# Patient Record
Sex: Male | Born: 1975 | Race: Black or African American | Hispanic: No | Marital: Single | Smoking: Current every day smoker
Health system: Southern US, Community
[De-identification: ages and names within clinical notes are randomized; demographics above are authoritative.]

## PROBLEM LIST (undated history)

## (undated) DIAGNOSIS — T7840XA Allergy, unspecified, initial encounter: Secondary | ICD-10-CM

## (undated) DIAGNOSIS — J45909 Unspecified asthma, uncomplicated: Secondary | ICD-10-CM

## (undated) DIAGNOSIS — M1990 Unspecified osteoarthritis, unspecified site: Secondary | ICD-10-CM

## (undated) HISTORY — DX: Unspecified asthma, uncomplicated: J45.909

## (undated) HISTORY — DX: Allergy, unspecified, initial encounter: T78.40XA

## (undated) HISTORY — PX: FINGER SURGERY: SHX640

## (undated) HISTORY — PX: FRACTURE SURGERY: SHX138

## (undated) HISTORY — DX: Unspecified osteoarthritis, unspecified site: M19.90

## (undated) HISTORY — PX: JOINT REPLACEMENT: SHX530

## (undated) HISTORY — PX: WRIST SURGERY: SHX841

## (undated) HISTORY — PX: KNEE SURGERY: SHX244

---

## 1997-05-09 ENCOUNTER — Emergency Department (HOSPITAL_COMMUNITY): Admission: EM | Admit: 1997-05-09 | Discharge: 1997-05-09 | Payer: Self-pay | Admitting: Emergency Medicine

## 1998-07-26 ENCOUNTER — Emergency Department (HOSPITAL_COMMUNITY): Admission: EM | Admit: 1998-07-26 | Discharge: 1998-08-15 | Payer: Self-pay | Admitting: Emergency Medicine

## 1998-07-27 ENCOUNTER — Emergency Department (HOSPITAL_COMMUNITY): Admission: EM | Admit: 1998-07-27 | Discharge: 1998-07-27 | Payer: Self-pay | Admitting: *Deleted

## 1998-12-21 ENCOUNTER — Emergency Department (HOSPITAL_COMMUNITY): Admission: EM | Admit: 1998-12-21 | Discharge: 1998-12-21 | Payer: Self-pay | Admitting: Emergency Medicine

## 1999-06-05 ENCOUNTER — Emergency Department (HOSPITAL_COMMUNITY): Admission: EM | Admit: 1999-06-05 | Discharge: 1999-06-05 | Payer: Self-pay | Admitting: Emergency Medicine

## 2001-10-28 ENCOUNTER — Emergency Department (HOSPITAL_COMMUNITY): Admission: EM | Admit: 2001-10-28 | Discharge: 2001-10-28 | Payer: Self-pay | Admitting: Emergency Medicine

## 2003-02-27 ENCOUNTER — Emergency Department (HOSPITAL_COMMUNITY): Admission: EM | Admit: 2003-02-27 | Discharge: 2003-02-27 | Payer: Self-pay | Admitting: Emergency Medicine

## 2004-04-06 ENCOUNTER — Emergency Department (HOSPITAL_COMMUNITY): Admission: EM | Admit: 2004-04-06 | Discharge: 2004-04-06 | Payer: Self-pay | Admitting: Emergency Medicine

## 2007-10-02 ENCOUNTER — Emergency Department (HOSPITAL_COMMUNITY): Admission: EM | Admit: 2007-10-02 | Discharge: 2007-10-02 | Payer: Self-pay | Admitting: Family Medicine

## 2011-01-02 ENCOUNTER — Emergency Department (HOSPITAL_COMMUNITY)
Admission: EM | Admit: 2011-01-02 | Discharge: 2011-01-02 | Disposition: A | Discharge status: Home / Self Care | Payer: Self-pay | Attending: Emergency Medicine | Admitting: Emergency Medicine

## 2011-01-02 ENCOUNTER — Encounter: Payer: Self-pay | Admitting: Emergency Medicine

## 2011-01-02 DIAGNOSIS — K0889 Other specified disorders of teeth and supporting structures: Secondary | ICD-10-CM

## 2011-01-02 DIAGNOSIS — K089 Disorder of teeth and supporting structures, unspecified: Secondary | ICD-10-CM | POA: Insufficient documentation

## 2011-01-02 DIAGNOSIS — K029 Dental caries, unspecified: Secondary | ICD-10-CM | POA: Insufficient documentation

## 2011-01-02 DIAGNOSIS — F17200 Nicotine dependence, unspecified, uncomplicated: Secondary | ICD-10-CM | POA: Insufficient documentation

## 2011-01-02 MED ORDER — PENICILLIN V POTASSIUM 500 MG PO TABS: 500.0000 mg | ORAL_TABLET | Freq: Three times a day (TID) | ORAL | Status: AC

## 2011-01-02 MED ORDER — OXYCODONE-ACETAMINOPHEN 5-325 MG PO TABS: 2.0000 | ORAL_TABLET | ORAL | Status: AC | PRN

## 2011-01-02 MED ORDER — OXYCODONE-ACETAMINOPHEN 5-325 MG PO TABS
2.0000 | ORAL_TABLET | Freq: Once | ORAL | Status: AC
  Administered 2011-01-02: 2 via ORAL
  Filled 2011-01-02: qty 2

## 2011-01-02 NOTE — ED Notes (Signed)
Pt has toothache on the upper right side that started on Saturday. Swollen and painful to the touch. He has trouble talking and eating. Has noted some drainage.

## 2011-01-02 NOTE — ED Provider Notes (Signed)
Medical screening examination/treatment/procedure(s) were conducted as a shared visit with non-physician practitioner(s) and myself.  I personally evaluated the patient during the encounter   Dione Booze, MD 01/02/11 [phone]

## 2011-01-02 NOTE — Progress Notes (Signed)
35 year old male broke off a right upper to while eating chicken. He is having a lot of pain in that area. Examination shows severely carious tooth #6. His gingiva is mildly swollen and tender. He will need to be referred to dentist to extract the remaining part of the tooth. We placed on antibiotics and given prescription for analgesics.

## 2011-01-02 NOTE — ED Provider Notes (Signed)
History     CSN: [id number] Arrival date & time: 01/02/2011  7:50 AM   First MD Initiated Contact with Timothy Li 01/02/11 [id number]      Chief Complaint  Timothy Li presents with  . Dental Pain    (Consider location/radiation/quality/duration/timing/severity/associated sxs/prior treatment) HPI Comments: Timothy Li reports that he was eating chicken several weeks and his upper right incisor tooth broke off.  He began having pain in that tooth for the past 4 days, which is becoming progressively worse.    Timothy Li is a 35 y.o. male presenting with tooth pain. The history is provided by the Timothy Li.  Dental PainThe primary symptoms include mouth pain and dental injury. Primary symptoms do not include oral bleeding, oral lesions, headaches, fever, shortness of breath, sore throat, angioedema or cough. The symptoms began yesterday. The symptoms are unchanged. The symptoms occur constantly.  Additional symptoms include: dental sensitivity to temperature. Additional symptoms do not include: gum swelling, gum tenderness, purulent gums, trismus, facial swelling, trouble swallowing, pain with swallowing, excessive salivation, drooling, ear pain, swollen glands and fatigue. Medical issues include: periodontal disease.    History reviewed. No pertinent past medical history.  History reviewed. No pertinent past surgical history.  No family history on file.  History  Substance Use Topics  . Smoking status: Current Everyday Smoker  . Smokeless tobacco: Not on file  . Alcohol Use: Yes      Review of Systems  Constitutional: Negative for fever, diaphoresis and fatigue.  HENT: Negative for ear pain, sore throat, facial swelling, drooling, trouble swallowing, neck pain, neck stiffness, tinnitus and ear discharge.   Eyes: Negative for visual disturbance.  Respiratory: Negative for cough and shortness of breath.   Cardiovascular: Negative for chest pain.  Gastrointestinal: Negative for abdominal pain.    Skin: Negative for rash.  Neurological: Negative for dizziness, syncope, light-headedness and headaches.    Allergies  Review of Timothy Li's allergies indicates no known allergies.  Home Medications   Current Outpatient Rx  Name Route Sig Dispense Refill  . ASPIRIN EC 325 MG PO TBEC Oral Take 325 mg by mouth every 4 (four) hours as needed. For pain     . OXYCODONE-ACETAMINOPHEN 5-325 MG PO TABS Oral Take 2 tablets by mouth every 4 (four) hours as needed for pain. 6 tablet 0  . PENICILLIN V POTASSIUM 500 MG PO TABS Oral Take 1 tablet (500 mg total) by mouth 3 (three) times daily. 30 tablet 0    BP 142/84  Pulse 70  Temp(Src) 98 F (36.7 C) (Oral)  Resp 18  SpO2 100%  Physical Exam  Constitutional: He is oriented to person, place, and time. He appears well-developed and well-nourished. No distress.  HENT:  Head: Normocephalic and atraumatic. No trismus in the jaw.  Right Ear: External ear normal.  Left Ear: External ear normal.  Nose: Nose normal.  Mouth/Throat: Uvula is midline, oropharynx is clear and moist and mucous membranes are normal. No oral lesions. Abnormal dentition. Dental caries present. No dental abscesses. No oropharyngeal exudate or posterior oropharyngeal edema.    Neck: Normal range of motion. Neck supple.  Cardiovascular: Normal rate, regular rhythm and normal heart sounds.   Pulmonary/Chest: Effort normal and breath sounds normal.  Musculoskeletal: Normal range of motion.  Neurological: He is alert and oriented to person, place, and time.  Skin: Skin is warm and dry. No rash noted. He is not diaphoretic. No erythema.  Psychiatric: He has a normal mood and affect.    ED Course  Procedures (  including critical care time)  Labs Reviewed - No data to display No results found.   1. Tooth pain   2. Tooth decay       MDM  Timothy Li with decay of tooth.  No abscess.  No trismus.  Normal ROM of neck.  Timothy Li given instructions to follow up with the  dentist on call.  Timothy Li given prescription for PCN and pain medication.        Pascal Lux [facility] 01/02/11 1542

## 2012-09-09 ENCOUNTER — Ambulatory Visit: Payer: Self-pay | Attending: Internal Medicine

## 2012-09-12 ENCOUNTER — Ambulatory Visit (HOSPITAL_COMMUNITY)
Admission: RE | Admit: 2012-09-12 | Discharge: 2012-09-12 | Disposition: A | Discharge status: Home / Self Care | Payer: No Typology Code available for payment source | Source: Ambulatory Visit | Attending: Internal Medicine | Admitting: Internal Medicine

## 2012-09-12 ENCOUNTER — Ambulatory Visit: Payer: No Typology Code available for payment source | Attending: Internal Medicine | Admitting: Internal Medicine

## 2012-09-12 VITALS — BP 149/91 | HR 92 | Temp 97.8°F | Resp 16 | Wt 153.6 lb

## 2012-09-12 DIAGNOSIS — M25532 Pain in left wrist: Secondary | ICD-10-CM

## 2012-09-12 DIAGNOSIS — M25569 Pain in unspecified knee: Secondary | ICD-10-CM | POA: Insufficient documentation

## 2012-09-12 DIAGNOSIS — M25539 Pain in unspecified wrist: Secondary | ICD-10-CM | POA: Insufficient documentation

## 2012-09-12 DIAGNOSIS — M25562 Pain in left knee: Secondary | ICD-10-CM

## 2012-09-12 LAB — LIPID PANEL
HDL: 73 mg/dL (ref >39)
LDL Cholesterol: 62 mg/dL (ref 0–99)
Triglycerides: 69 mg/dL (ref <150)
VLDL: 14 mg/dL (ref 0–40)

## 2012-09-12 MED ORDER — IBUPROFEN 600 MG PO TABS: 600.0000 mg | ORAL_TABLET | Freq: Three times a day (TID) | ORAL | Status: DC | PRN

## 2012-09-12 NOTE — Patient Instructions (Addendum)
Knee Pain  The knee is the complex joint between your thigh and your lower leg. It is made up of bones, tendons, ligaments, and cartilage. The bones that make up the knee are:   The femur in the thigh.   The tibia and fibula in the lower leg.   The patella or kneecap riding in the groove on the lower femur.  CAUSES   Knee pain is a common complaint with many causes. A few of these causes are:   Injury, such as:   A ruptured ligament or tendon injury.   Torn cartilage.   Medical conditions, such as:   Gout   Arthritis   Infections   Overuse, over training or overdoing a physical activity.  Knee pain can be minor or severe. Knee pain can accompany debilitating injury. Minor knee problems often respond well to self-care measures or get well on their own. More serious injuries may need medical intervention or even surgery.  SYMPTOMS  The knee is complex. Symptoms of knee problems can vary widely. Some of the problems are:   Pain with movement and weight bearing.   Swelling and tenderness.   Buckling of the knee.   Inability to straighten or extend your knee.   Your knee locks and you cannot straighten it.   Warmth and redness with pain and fever.   Deformity or dislocation of the kneecap.  DIAGNOSIS   Determining what is wrong may be very straight forward such as when there is an injury. It can also be challenging because of the complexity of the knee. Tests to make a diagnosis may include:   Your caregiver taking a history and doing a physical exam.   Routine X-rays can be used to rule out other problems. X-rays will not reveal a cartilage tear. Some injuries of the knee can be diagnosed by:   Arthroscopy a surgical technique by which a small video camera is inserted through tiny incisions on the sides of the knee. This procedure is used to examine and repair internal knee joint problems. Tiny instruments can be used during arthroscopy to repair the torn knee cartilage (meniscus).   Arthrography  is a radiology technique. A contrast liquid is directly injected into the knee joint. Internal structures of the knee joint then become visible on X-ray film.   An MRI scan is a non x-ray radiology procedure in which magnetic fields and a computer produce two- or three-dimensional images of the inside of the knee. Cartilage tears are often visible using an MRI scanner. MRI scans have largely replaced arthrography in diagnosing cartilage tears of the knee.   Blood work.   Examination of the fluid that helps to lubricate the knee joint (synovial fluid). This is done by taking a sample out using a needle and a syringe.  TREATMENT  The treatment of knee problems depends on the cause. Some of these treatments are:   Depending on the injury, proper casting, splinting, surgery or physical therapy care will be needed.   Give yourself adequate recovery time. Do not overuse your joints. If you begin to get sore during workout routines, back off. Slow down or do fewer repetitions.   For repetitive activities such as cycling or running, maintain your strength and nutrition.   Alternate muscle groups. For example if you are a weight lifter, work the upper body on one day and the lower body the next.   Either tight or weak muscles do not give the proper support for your   knee. Tight or weak muscles do not absorb the stress placed on the knee joint. Keep the muscles surrounding the knee strong.   Take care of mechanical problems.   If you have flat feet, orthotics or special shoes may help. See your caregiver if you need help.   Arch supports, sometimes with wedges on the inner or outer aspect of the heel, can help. These can shift pressure away from the side of the knee most bothered by osteoarthritis.   A brace called an "unloader" brace also may be used to help ease the pressure on the most arthritic side of the knee.   If your caregiver has prescribed crutches, braces, wraps or ice, use as directed. The acronym for  this is PRICE. This means protection, rest, ice, compression and elevation.   Nonsteroidal anti-inflammatory drugs (NSAID's), can help relieve pain. But if taken immediately after an injury, they may actually increase swelling. Take NSAID's with food in your stomach. Stop them if you develop stomach problems. Do not take these if you have a history of ulcers, stomach pain or bleeding from the bowel. Do not take without your caregiver's approval if you have problems with fluid retention, heart failure, or kidney problems.   For ongoing knee problems, physical therapy may be helpful.   Glucosamine and chondroitin are over-the-counter dietary supplements. Both may help relieve the pain of osteoarthritis in the knee. These medicines are different from the usual anti-inflammatory drugs. Glucosamine may decrease the rate of cartilage destruction.   Injections of a corticosteroid drug into your knee joint may help reduce the symptoms of an arthritis flare-up. They may provide pain relief that lasts a few months. You may have to wait a few months between injections. The injections do have a small increased risk of infection, water retention and elevated blood sugar levels.   Hyaluronic acid injected into damaged joints may ease pain and provide lubrication. These injections may work by reducing inflammation. A series of shots may give relief for as long as 6 months.   Topical painkillers. Applying certain ointments to your skin may help relieve the pain and stiffness of osteoarthritis. Ask your pharmacist for suggestions. Many over the-counter products are approved for temporary relief of arthritis pain.   In some countries, doctors often prescribe topical NSAID's for relief of chronic conditions such as arthritis and tendinitis. A review of treatment with NSAID creams found that they worked as well as oral medications but without the serious side effects.  PREVENTION   Maintain a healthy weight. Extra pounds put  more strain on your joints.   Get strong, stay limber. Weak muscles are a common cause of knee injuries. Stretching is important. Include flexibility exercises in your workouts.   Be smart about exercise. If you have osteoarthritis, chronic knee pain or recurring injuries, you may need to change the way you exercise. This does not mean you have to stop being active. If your knees ache after jogging or playing basketball, consider switching to swimming, water aerobics or other low-impact activities, at least for a few days a week. Sometimes limiting high-impact activities will provide relief.   Make sure your shoes fit well. Choose footwear that is right for your sport.   Protect your knees. Use the proper gear for knee-sensitive activities. Use kneepads when playing volleyball or laying carpet. Buckle your seat belt every time you drive. Most shattered kneecaps occur in car accidents.   Rest when you are tired.  SEEK MEDICAL CARE IF:     You have knee pain that is continual and does not seem to be getting better.   SEEK IMMEDIATE MEDICAL CARE IF:   Your knee joint feels hot to the touch and you have a high fever.  MAKE SURE YOU:    Understand these instructions.   Will watch your condition.   Will get help right away if you are not doing well or get worse.  Document Released: 10/29/2006 Document Revised: 03/26/2011 Document Reviewed: 10/29/2006  [employer] Patient Information 2014 [employer].
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
## 2012-09-12 NOTE — Progress Notes (Signed)
Patient ID: Timothy Li, male   DOB: 08/23/1975, 37 y.o.   MRN: [id number]  CC: New patient  HPI: 37 year old male with no significant past medical history who presented to our clinic to establish a new primary care provider. Patient has complaints of left knee pain and left wrist pain, chronic in nature. Patient reports limited range of motion. He is able to ambulate but with significant pain. No falls. No chest pain or shortness of breath or palpitations.  No Known Allergies History reviewed. No pertinent past medical history. Current Outpatient Prescriptions on File Prior to Visit  Medication Sig Dispense Refill  . aspirin EC 325 MG tablet Take 325 mg by mouth every 4 (four) hours as needed. For pain        No current facility-administered medications on file prior to visit.   Family medical history significant for HTN, HLD  History   Social History  . Marital Status: Single    Spouse Name: N/A    Number of Children: N/A  . Years of Education: N/A   Occupational History  . Not on file.   Social History Main Topics  . Smoking status: Current Every Day Smoker  . Smokeless tobacco: Not on file  . Alcohol Use: Yes  . Drug Use: Yes    Special: Marijuana  . Sexual Activity:    Other Topics Concern  . Not on file   Social History Narrative  . No narrative on file    Review of Systems  Constitutional: Negative for fever, chills, diaphoresis, activity change, appetite change and fatigue.  HENT: Negative for ear pain, nosebleeds, congestion, facial swelling, rhinorrhea, neck pain, neck stiffness and ear discharge.   Eyes: Negative for pain, discharge, redness, itching and visual disturbance.  Respiratory: Negative for cough, choking, chest tightness, shortness of breath, wheezing and stridor.   Cardiovascular: Negative for chest pain, palpitations and leg swelling.  Gastrointestinal: Negative for abdominal distention.  Genitourinary: Negative for dysuria, urgency,  frequency, hematuria, flank pain, decreased urine volume, difficulty urinating and dyspareunia.  Musculoskeletal: Per history of present illness.  Neurological: Negative for dizziness, tremors, seizures, syncope, facial asymmetry, speech difficulty, weakness, light-headedness, numbness and headaches.  Hematological: Negative for adenopathy. Does not bruise/bleed easily.  Psychiatric/Behavioral: Negative for hallucinations, behavioral problems, confusion, dysphoric mood, decreased concentration and agitation.    Objective:   Filed Vitals:   09/12/12 1056  BP: 149/91  Pulse: 92  Temp: 97.8 F (36.6 C)  Resp: 16    Physical Exam  Constitutional: Appears well-developed and well-nourished. No distress.  HENT: Normocephalic. External right and left ear normal. Oropharynx is clear and moist.  Eyes: Conjunctivae and EOM are normal. PERRLA, no scleral icterus.  Neck: Normal ROM. Neck supple. No JVD. No tracheal deviation. No thyromegaly.  CVS: RRR, S1/S2 +, no murmurs, no gallops, no carotid bruit.  Pulmonary: Effort and breath sounds normal, no stridor, rhonchi, wheezes, rales.  Abdominal: Soft. BS +,  no distension, tenderness, rebound or guarding.  Musculoskeletal: Limited range of motion in left knee and left wrist. The right side, normal range of motion  Lymphadenopathy: No lymphadenopathy noted, cervical, inguinal. Neuro: Alert. Normal reflexes, muscle tone coordination. No cranial nerve deficit. Skin: Skin is warm and dry. No rash noted. Not diaphoretic. No erythema. No pallor.  Psychiatric: Normal mood and affect. Behavior, judgment, thought content normal.   No results found for this basename: WBC, HGB, HCT, MCV, PLT   No results found for this basename: CREATININE, BUN, NA, K, CL,  CO2    No results found for this basename: HGBA1C   Lipid Panel  No results found for this basename: chol, trig, hdl, cholhdl, vldl, ldlcalc       Assessment and plan:   Patient Active Problem  List   Diagnosis Date Noted  . Left knee pain 09/12/2012    Priority: High - Followup x-ray of the left knee  - Prescription provided for ibuprofen when necessary for pain control   . Left wrist pain 09/12/2012    Priority: High - Obtain x-ray of the left wrist

## 2012-09-17 ENCOUNTER — Other Ambulatory Visit: Payer: Self-pay | Admitting: Internal Medicine

## 2012-09-17 MED ORDER — IBUPROFEN 800 MG PO TABS: 800.0000 mg | ORAL_TABLET | Freq: Three times a day (TID) | ORAL | Status: DC | PRN

## 2012-09-29 ENCOUNTER — Ambulatory Visit: Payer: No Typology Code available for payment source | Attending: Internal Medicine | Admitting: Family Medicine

## 2012-09-29 ENCOUNTER — Encounter: Payer: Self-pay | Admitting: Family Medicine

## 2012-09-29 ENCOUNTER — Other Ambulatory Visit: Payer: Self-pay | Admitting: Family Medicine

## 2012-09-29 VITALS — BP 128/82 | HR 56 | Temp 98.7°F | Resp 16 | Ht 66.0 in | Wt 164.0 lb

## 2012-09-29 DIAGNOSIS — M25539 Pain in unspecified wrist: Secondary | ICD-10-CM

## 2012-09-29 DIAGNOSIS — M1710 Unilateral primary osteoarthritis, unspecified knee: Secondary | ICD-10-CM

## 2012-09-29 DIAGNOSIS — IMO0001 Reserved for inherently not codable concepts without codable children: Secondary | ICD-10-CM

## 2012-09-29 DIAGNOSIS — M25532 Pain in left wrist: Secondary | ICD-10-CM

## 2012-09-29 DIAGNOSIS — IMO0002 Reserved for concepts with insufficient information to code with codable children: Secondary | ICD-10-CM

## 2012-09-29 DIAGNOSIS — K0889 Other specified disorders of teeth and supporting structures: Secondary | ICD-10-CM | POA: Insufficient documentation

## 2012-09-29 DIAGNOSIS — F17200 Nicotine dependence, unspecified, uncomplicated: Secondary | ICD-10-CM

## 2012-09-29 DIAGNOSIS — M25562 Pain in left knee: Secondary | ICD-10-CM

## 2012-09-29 DIAGNOSIS — M549 Dorsalgia, unspecified: Secondary | ICD-10-CM

## 2012-09-29 DIAGNOSIS — Z23 Encounter for immunization: Secondary | ICD-10-CM

## 2012-09-29 DIAGNOSIS — L988 Other specified disorders of the skin and subcutaneous tissue: Secondary | ICD-10-CM

## 2012-09-29 DIAGNOSIS — M25569 Pain in unspecified knee: Secondary | ICD-10-CM

## 2012-09-29 DIAGNOSIS — K089 Disorder of teeth and supporting structures, unspecified: Secondary | ICD-10-CM

## 2012-09-29 MED ORDER — IBUPROFEN 800 MG PO TABS: 800.0000 mg | ORAL_TABLET | Freq: Three times a day (TID) | ORAL | Status: DC | PRN

## 2012-09-29 MED ORDER — TRAMADOL HCL 50 MG PO TABS: 50.0000 mg | ORAL_TABLET | Freq: Three times a day (TID) | ORAL | Status: DC | PRN

## 2012-09-29 NOTE — Progress Notes (Signed)
Patient ID: Timothy Li, male   DOB: April 19, 1975, 37 y.o.   MRN: [id number]  CC:  Follow up  HPI: Pt reports chronic ongoing, relentless left knee pain, gait instability, low back pain, some relief with ibuprofen but still having significant pain, also having dental pain and not been called yet about his dentist appointment.  He is trying over the counter remedies and no improvement. He is still smoking but not ready to quit at this time.   No Known Allergies History reviewed. No pertinent past medical history. Current Outpatient Prescriptions on File Prior to Visit  Medication Sig Dispense Refill  . aspirin EC 325 MG tablet Take 325 mg by mouth every 4 (four) hours as needed. For pain       . ibuprofen (ADVIL,MOTRIN) 800 MG tablet Take 1 tablet (800 mg total) by mouth every 8 (eight) hours as needed for pain.  45 tablet  0   No current facility-administered medications on file prior to visit.   History reviewed. No pertinent family history. History   Social History  . Marital Status: Single    Spouse Name: N/A    Number of Children: N/A  . Years of Education: N/A   Occupational History  . Not on file.   Social History Main Topics  . Smoking status: Current Every Day Smoker  . Smokeless tobacco: Not on file  . Alcohol Use: Yes  . Drug Use: Yes    Special: Marijuana  . Sexual Activity:    Other Topics Concern  . Not on file   Social History Narrative  . No narrative on file    Review of Systems  Constitutional: Negative for fever, chills, diaphoresis, activity change, appetite change and fatigue.  HENT: Negative for ear pain, nosebleeds, congestion, facial swelling, rhinorrhea, neck pain, neck stiffness and ear discharge.   Eyes: Negative for pain, discharge, redness, itching and visual disturbance.  Respiratory: Negative for cough, choking, chest tightness, shortness of breath, wheezing and stridor.   Cardiovascular: Negative for chest pain, palpitations and leg  swelling.  Gastrointestinal: Negative for abdominal distention.  Genitourinary: Negative for dysuria, urgency, frequency, hematuria, flank pain, decreased urine volume, difficulty urinating and dyspareunia.  Musculoskeletal: back pain, left knee pain, gait instability  Neurological: Negative for dizziness, tremors, seizures, syncope, facial asymmetry, speech difficulty, weakness, light-headedness, numbness and headaches.  Hematological: Negative for adenopathy. Does not bruise/bleed easily.  Psychiatric/Behavioral: Negative for hallucinations, behavioral problems, confusion, dysphoric mood, decreased concentration and agitation.    Objective:   Filed Vitals:   09/29/12 1251  BP: 128/82  Pulse: 56  Temp: 98.7 F (37.1 C)  Resp: 16    Physical Exam  Constitutional: Appears well-developed and well-nourished. No distress.  HENT: Normocephalic. External right and left ear normal. Oropharynx is clear and moist.  Eyes: Conjunctivae and EOM are normal. PERRLA, no scleral icterus.  Neck: Normal ROM. Neck supple. No JVD. No tracheal deviation. No thyromegaly.  CVS: RRR, S1/S2 +, no murmurs, no gallops, no carotid bruit.  Pulmonary: Effort and breath sounds normal, no stridor, rhonchi, wheezes, rales.  Abdominal: Soft. BS +,  no distension, tenderness, rebound or guarding.  Musculoskeletal: chronic degenerative changes and scarring of left knee, tenderness of lumbar spine. large sebaceous cyst on finger noted. Lymphadenopathy: No lymphadenopathy noted, cervical, inguinal. Neuro: Alert. Normal reflexes, muscle tone coordination. No cranial nerve deficit. Skin: Skin is warm and dry. No rash noted. Not diaphoretic. No erythema. No pallor.  Psychiatric: Normal mood and affect. Behavior, judgment, thought  content normal.   No results found for this basename: WBC, HGB, HCT, MCV, PLT   No results found for this basename: CREATININE, BUN, NA, K, CL, CO2    No results found for this basename:  HGBA1C   Lipid Panel     Component Value Date/Time   CHOL 149 09/12/2012 1118   TRIG 69 09/12/2012 1118   HDL 73 09/12/2012 1118   CHOLHDL 2.0 09/12/2012 1118   VLDL 14 09/12/2012 1118   LDLCALC 62 09/12/2012 1118       Assessment and plan:   Patient Active Problem List   Diagnosis Date Noted  . Smoking 09/29/2012  . Cyst of finger 09/29/2012  . Arthritis of knee 09/29/2012  . Left knee pain 09/12/2012  . Left wrist pain 09/12/2012   Smoking  Cyst of finger - pt reporting that it is asymptomatic and not causing problems at this time.    Arthritis of knee posttraumatic  Continue the ibuprofen 800 mg as needed for arthritis pain.   Tramadol 50 mg prn severe pain  The patient was counseled on the dangers of tobacco use, and was advised to quit.  Reviewed strategies to maximize success, including removing cigarettes and smoking materials from environment and stress management.  Referral to PT and sports medicine to see if they can help him with his gait instability and chronic knee and back pain.    Flu vaccine given today.    The patient was given clear instructions to go to ER or return to medical center if symptoms don't improve, worsen or new problems develop.  The patient verbalized understanding.  The patient was told to call to get any lab results if not heard anything in the next week.    Rodney Langton, MD, CDE, FAAFP [facility] [place], [place]

## 2012-09-29 NOTE — Progress Notes (Signed)
PT HERE FOR PAIN MEDS FOR CHRONIC LEFT WRIST/  L LEG PAIN. PT WAS SEEN HERE 8/29. PRESCRIBED IBUPROFEN 800MG  BUT STATES NOT WORKING

## 2012-09-29 NOTE — Patient Instructions (Signed)
Osteoarthritis Osteoarthritis is the most common form of arthritis. It is redness, soreness, and swelling (inflammation) affecting the cartilage. Cartilage acts as a cushion, covering the ends of bones where they meet to form a joint. CAUSES  Over time, the cartilage begins to wear away. This causes bone to rub on bone. This produces pain and stiffness in the affected joints. Factors that contribute to this problem are:  Excessive body weight.  Age.  Overuse of joints. SYMPTOMS   People with osteoarthritis usually experience joint pain, swelling, or stiffness.  Over time, the joint may lose its normal shape.  Small deposits of bone (osteophytes) may grow on the edges of the joint.  Bits of bone or cartilage can break off and float inside the joint space. This may cause more pain and damage.  Osteoarthritis can lead to depression, anxiety, feelings of helplessness, and limitations on daily activities. The most commonly affected joints are in the:  Ends of the fingers.  Thumbs.  Neck.  Lower back.  Knees.  Hips. DIAGNOSIS  Diagnosis is mostly based on your symptoms and exam. Tests may be helpful, including:  X-rays of the affected joint.  A computerized magnetic scan (MRI).  Blood tests to rule out other types of arthritis.  Joint fluid tests. This involves using a needle to draw fluid from the joint and examining the fluid under a microscope. TREATMENT  Goals of treatment are to control pain, improve joint function, maintain a normal body weight, and maintain a healthy lifestyle. Treatment approaches may include:  A prescribed exercise program with rest and joint relief.  Weight control with nutritional education.  Pain relief techniques such as:  Properly applied heat and cold.  Electric pulses delivered to nerve endings under the skin (transcutaneous electrical nerve stimulation, TENS).  Massage.  Certain supplements. Ask your caregiver before using any  supplements, especially in combination with prescribed drugs.  Medicines to control pain, such as:  Acetaminophen.  Nonsteroidal anti-inflammatory drugs (NSAIDs), such as naproxen.  Narcotic or central-acting agents, such as tramadol. This drug carries a risk of addiction and is generally prescribed for short-term use.  Corticosteroids. These can be given orally or as injection. This is a short-term treatment, not recommended for routine use.  Surgery to reposition the bones and relieve pain (osteotomy) or to remove loose pieces of bone and cartilage. Joint replacement may be needed in advanced states of osteoarthritis. HOME CARE INSTRUCTIONS  Your caregiver can recommend specific types of exercise. These may include:  Strengthening exercises. These are done to strengthen the muscles that support joints affected by arthritis. They can be performed with weights or with exercise bands to add resistance.  Aerobic activities. These are exercises, such as brisk walking or low-impact aerobics, that get your heart pumping. They can help keep your lungs and circulatory system in shape.  Range-of-motion activities. These keep your joints limber.  Balance and agility exercises. These help you maintain daily living skills. Learning about your condition and being actively involved in your care will help improve the course of your osteoarthritis. SEEK MEDICAL CARE IF:   You feel hot or your skin turns red.  You develop a rash in addition to your joint pain.  You have an oral temperature above 102 F (38.9 C). FOR MORE INFORMATION  National Institute of Arthritis and Musculoskeletal and Skin Diseases: www.niams.nih.gov National Institute on Aging: www.nia.nih.gov American College of Rheumatology: www.rheumatology.org Document Released: 01/01/2005 Document Revised: 03/26/2011 Document Reviewed: 04/14/2009 [employer] Patient Information 2014 [employer].    Smoking Cessation, Tips for  Success YOU CAN QUIT SMOKING If you are ready to quit smoking, congratulations! You have chosen to help yourself be healthier. Cigarettes bring nicotine, tar, carbon monoxide, and other irritants into your body. Your lungs, heart, and blood vessels will be able to work better without these poisons. There are many different ways to quit smoking. Nicotine gum, nicotine patches, a nicotine inhaler, or nicotine nasal spray can help with physical craving. Hypnosis, support groups, and medicines help break the habit of smoking. Here are some tips to help you quit for good.  Throw away all cigarettes.  Clean and remove all ashtrays from your home, work, and car.  On a card, write down your reasons for quitting. Carry the card with you and read it when you get the urge to smoke.  Cleanse your body of nicotine. Drink enough water and fluids to keep your urine clear or pale yellow. Do this after quitting to flush the nicotine from your body.  Learn to predict your moods. Do not let a bad situation be your excuse to have a cigarette. Some situations in your life might tempt you into wanting a cigarette.  Never have "just one" cigarette. It leads to wanting another and another. Remind yourself of your decision to quit.  Change habits associated with smoking. If you smoked while driving or when feeling stressed, try other activities to replace smoking. Stand up when drinking your coffee. Brush your teeth after eating. Sit in a different chair when you read the paper. Avoid alcohol while trying to quit, and try to drink fewer caffeinated beverages. Alcohol and caffeine may urge you to smoke.  Avoid foods and drinks that can trigger a desire to smoke, such as sugary or spicy foods and alcohol.  Ask people who smoke not to smoke around you.  Have something planned to do right after eating or having a cup of coffee. Take a walk or exercise to perk you up. This will help to keep you from overeating.  Try a  relaxation exercise to calm you down and decrease your stress. Remember, you may be tense and nervous for the first 2 weeks after you quit, but this will pass.  Find new activities to keep your hands busy. Play with a pen, coin, or rubber band. Doodle or draw things on paper.  Brush your teeth right after eating. This will help cut down on the craving for the taste of tobacco after meals. You can try mouthwash, too.  Use oral substitutes, such as lemon drops, carrots, a cinnamon stick, or chewing gum, in place of cigarettes. Keep them handy so they are available when you have the urge to smoke.  When you have the urge to smoke, try deep breathing.  Designate your home as a nonsmoking area.  If you are a heavy smoker, ask your caregiver about a prescription for nicotine chewing gum. It can ease your withdrawal from nicotine.  Reward yourself. Set aside the cigarette money you save and buy yourself something nice.  Look for support from others. Join a support group or smoking cessation program. Ask someone at home or at work to help you with your plan to quit smoking.  Always ask yourself, "Do I need this cigarette or is this just a reflex?" Tell yourself, "Today, I choose not to smoke," or "I do not want to smoke." You are reminding yourself of your decision to quit, even if you do smoke a cigarette. HOW WILL I FEEL  WHEN I QUIT SMOKING?  The benefits of not smoking start within days of quitting.  You may have symptoms of withdrawal because your body is used to nicotine (the addictive substance in cigarettes). You may crave cigarettes, be irritable, feel very hungry, cough often, get headaches, or have difficulty concentrating.  The withdrawal symptoms are only temporary. They are strongest when you first quit but will go away within 10 to 14 days.  When withdrawal symptoms occur, stay in control. Think about your reasons for quitting. Remind yourself that these are signs that your body is  healing and getting used to being without cigarettes.  Remember that withdrawal symptoms are easier to treat than the major diseases that smoking can cause.  Even after the withdrawal is over, expect periodic urges to smoke. However, these cravings are generally short-lived and will go away whether you smoke or not. Do not smoke!  If you relapse and smoke again, do not lose hope. Most smokers quit 3 times before they are successful.  If you relapse, do not give up! Plan ahead and think about what you will do the next time you get the urge to smoke. LIFE AS A NONSMOKER: MAKE IT FOR A MONTH, MAKE IT FOR LIFE Day 1: Hang this page where you will see it every day. Day 2: Get rid of all ashtrays, matches, and lighters. Day 3: Drink water. Breathe deeply between sips. Day 4: Avoid places with smoke-filled air, such as bars, clubs, or the smoking section of restaurants. Day 5: Keep track of how much money you save by not smoking. Day 6: Avoid boredom. Keep a good book with you or go to the movies. Day 7: Reward yourself! One week without smoking! Day 8: Make a dental appointment to get your teeth cleaned. Day 9: Decide how you will turn down a cigarette before it is offered to you. Day 10: Review your reasons for quitting. Day 11: Distract yourself. Stay active to keep your mind off smoking and to relieve tension. Take a walk, exercise, read a book, do a crossword puzzle, or try a new hobby. Day 12: Exercise. Get off the bus before your stop or use stairs instead of escalators. Day 13: Call on friends for support and encouragement. Day 14: Reward yourself! Two weeks without smoking! Day 15: Practice deep breathing exercises. Day 16: Bet a friend that you can stay a nonsmoker. Day 17: Ask to sit in nonsmoking sections of restaurants. Day 18: Hang up "No Smoking" signs. Day 19: Think of yourself as a nonsmoker. Day 20: Each morning, tell yourself you will not smoke. Day 21: Reward yourself!  Three weeks without smoking! Day 22: Think of smoking in negative ways. Remember how it stains your teeth, gives you bad breath, and leaves you short of breath. Day 23: Eat a nutritious breakfast. Day 24:Do not relive your days as a smoker. Day 25: Hold a pencil in your hand when talking on the telephone. Day 26: Tell all your friends you do not smoke. Day 27: Think about how much better food tastes. Day 28: Remember, one cigarette is one too many. Day 29: Take up a hobby that will keep your hands busy. Day 30: Congratulations! One month without smoking! Give yourself a big reward. Your caregiver can direct you to community resources or hospitals for support, which may include:  Group support.  Education.  Hypnosis.  Subliminal therapy. Document Released: 09/30/2003 Document Revised: 03/26/2011 Document Reviewed: 10/18/2008 [facility] Patient Information 2014 [place], [place].  Smoking Hazards Smoking cigarettes is extremely bad for your health. Tobacco smoke has over 200 known poisons in it. There are over 60 chemicals in tobacco smoke that cause cancer. Some of the chemicals found in cigarette smoke include:   Cyanide.  Benzene.  Formaldehyde.  Methanol (wood alcohol).  Acetylene (fuel used in welding torches).  Ammonia. Cigarette smoke also contains the poisonous gases nitrogen oxide and carbon monoxide.  Cigarette smokers have an increased risk of many serious medical problems, including:  Lung cancer.  Lung disease (such as pneumonia, bronchitis, and emphysema).  Heart attack and chest pain due to the heart not getting enough oxygen (angina).  Heart disease and peripheral blood vessel disease.  Hypertension.  Stroke.  Oral cancer (cancer of the lip, mouth, or voice box).  Bladder cancer.  Pancreatic cancer.  Cervical cancer.  Pregnancy complications, including premature birth.  Low birthweight babies.  Early menopause.  Lower estrogen level for  women.  Infertility.  Facial wrinkles.  Blindness.  Increased risk of broken bones (fractures).  Senile dementia.  Stillbirths and smaller newborn babies, birth defects, and genetic damage to sperm.  Stomach ulcers and internal bleeding. Children of smokers have an increased risk of the following, because of secondhand smoke exposure:   Sudden infant death syndrome (SIDS).  Respiratory infections.  Lung cancer.  Heart disease.  Ear infections. Smoking causes approximately:  90% of all lung cancer deaths in men.  80% of all lung cancer deaths in women.  90% of deaths from chronic obstructive lung disease. Compared with nonsmokers, smoking increases the risk of:  Coronary heart disease by 2 to 4 times.  Stroke by 2 to 4 times.  Men developing lung cancer by 23 times.  Women developing lung cancer by 13 times.  Dying from chronic obstructive lung diseases by 12 times. Someone who smokes 2 packs a day loses about 8 years of his or her life. Even smoking lightly shortens your life expectancy by several years. You can greatly reduce the risk of medical problems for you and your family by stopping now. Smoking is the most preventable cause of death and disease in our society. Within days of quitting smoking, your circulation returns to normal, you decrease the risk of having a heart attack, and your lung capacity improves. There may be some increased phlegm in the first few days after quitting, and it may take months for your lungs to clear up completely. Quitting for 10 years cuts your lung cancer risk to almost that of a nonsmoker. WHY IS SMOKING ADDICTIVE?  Nicotine is the chemical agent in tobacco that is capable of causing addiction or dependence.  When you smoke and inhale, nicotine is absorbed rapidly into the bloodstream through your lungs. Nicotine absorbed through the lungs is capable of creating a powerful addiction. Both inhaled and non-inhaled nicotine may be  addictive.  Addiction studies of cigarettes and spit tobacco show that addiction to nicotine occurs mainly during the teen years, when young people begin using tobacco products. WHAT ARE THE BENEFITS OF QUITTING?  There are many health benefits to quitting smoking.   Likelihood of developing cancer and heart disease decreases. Health improvements are seen almost immediately.  Blood pressure, pulse rate, and breathing patterns start returning to normal soon after quitting.  People who quit may see an improvement in their overall quality of life. Some people choose to quit all at once. Other options include nicotine replacement products, such as patches, gum, and nasal sprays. Do not use  these products without first checking with your caregiver. QUITTING SMOKING It is not easy to quit smoking. Nicotine is addicting, and longtime habits are hard to change. To start, you can write down all your reasons for quitting, tell your family and friends you want to quit, and ask for their help. Throw your cigarettes away, chew gum or cinnamon sticks, keep your hands busy, and drink extra water or juice. Go for walks and practice deep breathing to relax. Think of all the money you are saving: around $1,000 a year, for the average pack-a-day smoker. Nicotine patches and gum have been shown to improve success at efforts to stop smoking. Zyban (bupropion) is an anti-depressant drug that can be prescribed to reduce nicotine withdrawal symptoms and to suppress the urge to smoke. Smoking is an addiction with both physical and psychological effects. Joining a stop-smoking support group can help you cope with the emotional issues. For more information and advice on programs to stop smoking, call your doctor, your local hospital, or these organizations:  American Lung Association - 1-800-LUNGUSA  American Cancer Society - 1-800-ACS-2345 Document Released: 02/09/2004 Document Revised: 03/26/2011 Document Reviewed:  10/13/2008 [facility] Patient Information 2014 [place], [place].

## 2012-10-13 ENCOUNTER — Encounter: Payer: Self-pay | Admitting: Sports Medicine

## 2012-10-13 ENCOUNTER — Ambulatory Visit (HOSPITAL_COMMUNITY)
Admission: RE | Admit: 2012-10-13 | Discharge: 2012-10-13 | Disposition: A | Discharge status: Home / Self Care | Payer: No Typology Code available for payment source | Source: Ambulatory Visit | Attending: Sports Medicine | Admitting: Sports Medicine

## 2012-10-13 ENCOUNTER — Ambulatory Visit (INDEPENDENT_AMBULATORY_CARE_PROVIDER_SITE_OTHER): Payer: No Typology Code available for payment source | Admitting: Sports Medicine

## 2012-10-13 VITALS — BP 119/82 | HR 87 | Ht 66.0 in | Wt 164.0 lb

## 2012-10-13 DIAGNOSIS — M1710 Unilateral primary osteoarthritis, unspecified knee: Secondary | ICD-10-CM

## 2012-10-13 DIAGNOSIS — M1712 Unilateral primary osteoarthritis, left knee: Secondary | ICD-10-CM

## 2012-10-13 DIAGNOSIS — M25569 Pain in unspecified knee: Secondary | ICD-10-CM | POA: Insufficient documentation

## 2012-10-13 DIAGNOSIS — M25562 Pain in left knee: Secondary | ICD-10-CM

## 2012-10-13 DIAGNOSIS — M25469 Effusion, unspecified knee: Secondary | ICD-10-CM | POA: Insufficient documentation

## 2012-10-13 MED ORDER — MELOXICAM 15 MG PO TABS: 15.0000 mg | ORAL_TABLET | Freq: Every day | ORAL | Status: DC

## 2012-10-13 MED ORDER — METHYLPREDNISOLONE ACETATE 40 MG/ML IJ SUSP
40.0000 mg | Freq: Once | INTRAMUSCULAR | Status: AC
  Administered 2012-10-13: 40 mg via INTRA_ARTICULAR
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
## 2012-10-29 ENCOUNTER — Ambulatory Visit: Payer: No Typology Code available for payment source | Attending: Internal Medicine | Admitting: Internal Medicine

## 2012-10-29 VITALS — BP 128/87 | HR 80 | Temp 98.2°F | Resp 16 | Wt 162.0 lb

## 2012-10-29 DIAGNOSIS — M25562 Pain in left knee: Secondary | ICD-10-CM

## 2012-10-29 DIAGNOSIS — M1710 Unilateral primary osteoarthritis, unspecified knee: Secondary | ICD-10-CM

## 2012-10-29 DIAGNOSIS — M549 Dorsalgia, unspecified: Secondary | ICD-10-CM

## 2012-10-29 DIAGNOSIS — M25569 Pain in unspecified knee: Secondary | ICD-10-CM

## 2012-10-29 MED ORDER — MELOXICAM 15 MG PO TABS: 15.0000 mg | ORAL_TABLET | Freq: Every day | ORAL | Status: DC

## 2012-10-29 MED ORDER — TRAMADOL HCL 50 MG PO TABS: 50.0000 mg | ORAL_TABLET | Freq: Three times a day (TID) | ORAL | Status: DC | PRN

## 2012-10-29 MED ORDER — IBUPROFEN 800 MG PO TABS: 800.0000 mg | ORAL_TABLET | Freq: Three times a day (TID) | ORAL | Status: DC | PRN

## 2012-10-29 NOTE — Progress Notes (Signed)
Patient here for follow up for left knee pain

## 2012-10-29 NOTE — Progress Notes (Signed)
Patient ID: Timothy Li, male   DOB: 05-05-1975, 37 y.o.   MRN: [id number] Patient Demographics  Timothy Li, is a 37 y.o. male  WUJ:[id number]  [id number]  DOB - [birth year]  Chief Complaint  Patient presents with  . Knee Pain        Subjective:   Timothy Li today is here for a follow up visit.  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 Patient reports that his left knee has still continued to work however it did improve with cortisone shot in left knee [facility] office. Patient was recommended further x-rays of his left knee that showed advanced osteoarthritis and was recommended total knee arthroplasty. He does not want to pursue total knee replacement until it "is time". Patient reports that he has applied for disability Patient has No headache, No chest pain, No abdominal pain - No Nausea, No new weakness tingling or numbness, No Cough - SOB.   Objective:    Filed Vitals:   10/29/12 1044  BP: 128/87  Pulse: 80  Temp: 98.2 F (36.8 C)  Resp: 16  Weight: 162 lb (73.483 kg)  SpO2: 100%     ALLERGIES:  No Known Allergies  PAST MEDICAL HISTORY: History reviewed. No pertinent past medical history.  MEDICATIONS AT HOME: Prior to Admission medications   Medication Sig Start Date End Date Taking? Authorizing Provider  ibuprofen (ADVIL,MOTRIN) 800 MG tablet Take 1 tablet (800 mg total) by mouth every 8 (eight) hours as needed for pain or fever. 10/29/12   Ripudeep Jenna Luo, MD  meloxicam (MOBIC) 15 MG tablet Take 1 tablet (15 mg total) by mouth daily. 10/29/12   Ripudeep Jenna Luo, MD  traMADol (ULTRAM) 50 MG tablet Take 1-2 tablets (50-100 mg total) by mouth every 8 (eight) hours as needed for pain. 10/29/12   Ripudeep Jenna Luo, MD     Exam  General appearance :Awake, alert, NAD, Speech Clear.  HEENT: Atraumatic and Normocephalic, PERLA Neck: supple, no JVD. No cervical lymphadenopathy.   Chest: Clear to auscultation bilaterally, no wheezing, rales or rhonchi CVS: S1 S2 regular, no murmurs.  Abdomen: soft, NBS, NT, ND, no gaurding, rigidity or rebound. Extremities: no cyanosis or clubbing, B/L Lower Ext shows no edema Neurology: Awake alert, and oriented X 3, CN II-XII intact, Non focal Skin: No Rash or lesions Wounds:N/A    Data Review   Basic Metabolic Panel: No results found for this basename: NA, K, CL, CO2, GLUCOSE, BUN, CREATININE, CALCIUM, MG, PHOS,  in the last 168 hours Liver Function Tests: No results found for this basename: AST, ALT, ALKPHOS, BILITOT, PROT, ALBUMIN,  in the last 168 hours  CBC: No results found for this basename: WBC, NEUTROABS, HGB, HCT, MCV, PLT,  in the last 168 hours  ------------------------------------------------------------------------------------------------------------------ No results found for this basename: HGBA1C,  in the last 72 hours ------------------------------------------------------------------------------------------------------------------ No results found for this basename: CHOL, HDL, LDLCALC, TRIG, CHOLHDL, LDLDIRECT,  in the last 72 hours ------------------------------------------------------------------------------------------------------------------ No results found for this basename: TSH, T4TOTAL, FREET3, T3FREE, THYROIDAB,  in the last 72 hours ------------------------------------------------------------------------------------------------------------------ No results found for this basename: VITAMINB12, FOLATE, FERRITIN, TIBC, IRON, RETICCTPCT,  in the last 72 hours  Coagulation profile  No results found for this basename: INR, PROTIME,  in the last 168 hours    Assessment & Plan   Active Problems: Left knee arthritis - Reviewed orthopedic recommendations  - renewed ibuprofen, tramadol, Mobic -  Recommended patient to continue followup with orthopedics regarding cortisone injection and further  evaluation  Patient reports that he has received a flu shot last visit He declined any screening labs for today- states "I am fine, don't need labs"   Follow-up in 3 months    RAI,RIPUDEEP M.D. 10/29/2012, 10:57 AM

## 2013-01-29 ENCOUNTER — Ambulatory Visit: Payer: Self-pay | Admitting: Internal Medicine

## 2013-02-08 ENCOUNTER — Encounter (HOSPITAL_COMMUNITY): Payer: Self-pay | Admitting: Emergency Medicine

## 2013-02-08 ENCOUNTER — Emergency Department (INDEPENDENT_AMBULATORY_CARE_PROVIDER_SITE_OTHER)
Admission: EM | Admit: 2013-02-08 | Discharge: 2013-02-08 | Disposition: A | Discharge status: Home / Self Care | Payer: No Typology Code available for payment source | Source: Home / Self Care | Attending: Family Medicine | Admitting: Family Medicine

## 2013-02-08 DIAGNOSIS — K0889 Other specified disorders of teeth and supporting structures: Secondary | ICD-10-CM

## 2013-02-08 DIAGNOSIS — K089 Disorder of teeth and supporting structures, unspecified: Secondary | ICD-10-CM

## 2013-02-08 MED ORDER — CLINDAMYCIN HCL 300 MG PO CAPS: 300.0000 mg | ORAL_CAPSULE | Freq: Three times a day (TID) | ORAL | Status: DC

## 2013-02-08 MED ORDER — DICLOFENAC POTASSIUM 50 MG PO TABS: 50.0000 mg | ORAL_TABLET | Freq: Three times a day (TID) | ORAL | Status: DC

## 2013-02-08 NOTE — ED Notes (Signed)
Toothache   With  Facial   Swelling /  Toothache       Pt  Says the symptoms  Started  About  1      Week  Ago      pt  staes he  Has  An appt  With dentist in about  1   Week

## 2013-02-08 NOTE — Discharge Instructions (Signed)
Take medicine as prescribed, continue warm salt rinses twice a day. see your dentist as soon as possible

## 2013-02-08 NOTE — ED Provider Notes (Signed)
CSN: [id number]     Arrival date & time 02/08/13  1244 History   First MD Initiated Contact with Patient 02/08/13 1332     Chief Complaint  Patient presents with  . Facial Swelling   (Consider location/radiation/quality/duration/timing/severity/associated sxs/prior Treatment) Patient is a 38 y.o. male presenting with tooth pain. The history is provided by the patient.  Dental Pain Location:  Lower Lower teeth location:  32/RL 3rd molar Quality:  Throbbing Severity:  Moderate Duration:  1 week Timing:  Constant Progression:  Worsening Chronicity:  New Context: abscess, dental caries and filling fell out   Relieved by:  None tried Worsened by:  Nothing tried Ineffective treatments:  None tried Associated symptoms: facial swelling and gum swelling   Risk factors: lack of dental care, periodontal disease and smoking     History reviewed. No pertinent past medical history. History reviewed. No pertinent past surgical history. History reviewed. No pertinent family history. History  Substance Use Topics  . Smoking status: Current Every Day Smoker  . Smokeless tobacco: Not on file  . Alcohol Use: Yes    Review of Systems  Constitutional: Negative.   HENT: Positive for dental problem and facial swelling.     Allergies  Review of patient's allergies indicates no known allergies.  Home Medications   Current Outpatient Rx  Name  Route  Sig  Dispense  Refill  . clindamycin (CLEOCIN) 300 MG capsule   Oral   Take 1 capsule (300 mg total) by mouth 3 (three) times daily.   28 capsule   0   . diclofenac (CATAFLAM) 50 MG tablet   Oral   Take 1 tablet (50 mg total) by mouth 3 (three) times daily. Prn dental pain   30 tablet   0   . ibuprofen (ADVIL,MOTRIN) 800 MG tablet   Oral   Take 1 tablet (800 mg total) by mouth every 8 (eight) hours as needed for pain or fever.   60 tablet   5   . meloxicam (MOBIC) 15 MG tablet   Oral   Take 1 tablet (15 mg total) by mouth  daily.   60 tablet   3   . traMADol (ULTRAM) 50 MG tablet   Oral   Take 1-2 tablets (50-100 mg total) by mouth every 8 (eight) hours as needed for pain.   60 tablet   3    BP 117/83  Pulse 60  Temp(Src) 98.4 F (36.9 C) (Oral)  Resp 18 Physical Exam  Nursing note and vitals reviewed. Constitutional: He appears well-developed and well-nourished.  HENT:  Head: Normocephalic.  Right Ear: External ear normal.  Left Ear: External ear normal.  Mouth/Throat: Oropharynx is clear and moist and mucous membranes are normal. Abnormal dentition. Dental abscesses and dental caries present.      ED Course  Procedures (including critical care time) Labs Review Labs Reviewed - No data to display Imaging Review No results found.  EKG Interpretation    Date/Time:    Ventricular Rate:    PR Interval:    QRS Duration:   QT Interval:    QTC Calculation:   R Axis:     Text Interpretation:              MDM     Linna HoffJames D Meera Vasco, MD 02/08/13 1423

## 2013-02-09 MED ORDER — DICLOFENAC POTASSIUM 50 MG PO TABS: 50.0000 mg | ORAL_TABLET | Freq: Three times a day (TID) | ORAL | Status: DC

## 2013-02-09 MED ORDER — CLINDAMYCIN HCL 300 MG PO CAPS: 300.0000 mg | ORAL_CAPSULE | Freq: Three times a day (TID) | ORAL | Status: DC

## 2013-04-23 ENCOUNTER — Encounter: Payer: Self-pay | Admitting: Internal Medicine

## 2013-04-23 ENCOUNTER — Ambulatory Visit: Payer: No Typology Code available for payment source | Attending: Internal Medicine | Admitting: Internal Medicine

## 2013-04-23 VITALS — BP 120/77 | HR 78 | Temp 99.0°F | Resp 16 | Ht 66.0 in | Wt 165.0 lb

## 2013-04-23 DIAGNOSIS — M25569 Pain in unspecified knee: Secondary | ICD-10-CM | POA: Insufficient documentation

## 2013-04-23 DIAGNOSIS — K089 Disorder of teeth and supporting structures, unspecified: Secondary | ICD-10-CM | POA: Insufficient documentation

## 2013-04-23 DIAGNOSIS — F17200 Nicotine dependence, unspecified, uncomplicated: Secondary | ICD-10-CM | POA: Insufficient documentation

## 2013-04-23 DIAGNOSIS — K0889 Other specified disorders of teeth and supporting structures: Secondary | ICD-10-CM

## 2013-04-23 DIAGNOSIS — M25562 Pain in left knee: Secondary | ICD-10-CM

## 2013-04-23 MED ORDER — ACETAMINOPHEN-CODEINE #3 300-30 MG PO TABS: 1.0000 | ORAL_TABLET | ORAL | Status: DC | PRN

## 2013-04-23 NOTE — Progress Notes (Signed)
Pt is here following up on his chronic left knee pain. Pt reports that he has an infection in his mouth and he needs to see a dentist asap.

## 2013-04-23 NOTE — Progress Notes (Signed)
Patient ID: Timothy Li, male   DOB: 1975/09/17, 38 y.o.   MRN: [id number]   Timothy Li, is a 38 y.o. male  WUJ:[id number]  NFA:[id number]  DOB - [birth year]  Chief Complaint  Patient presents with  . Follow-up        Subjective:   Timothy Li is a 38 y.o. male here today for a follow up visit. Pt is here following up on his chronic left knee pain.  Pt reports that he has an infection in his mouth and he needs to see a dentist asap. He had injury to his left knee long time ago followed by surgery and since then has had pain in the left knee. No new injury. Patient requesting narcotics. He was recently treated for dental abscess with clindamycin but has not been able to see a dentist following treatment, he needs a referral as soon as possible. He continues to smoke heavily about one pack of cigarettes per day. He drinks alcohol. Patient has No headache, No chest pain, No abdominal pain - No Nausea, No new weakness tingling or numbness, No Cough - SOB.  No problems updated.  ALLERGIES: No Known Allergies  PAST MEDICAL HISTORY: Past Medical History  Diagnosis Date  . Arthritis     MEDICATIONS AT HOME: Prior to Admission medications   Medication Sig Start Date End Date Taking? Authorizing Provider  clindamycin (CLEOCIN) 300 MG capsule Take 1 capsule (300 mg total) by mouth 3 (three) times daily. 02/08/13  Yes Linna Hoff, MD  meloxicam (MOBIC) 15 MG tablet Take 1 tablet (15 mg total) by mouth daily. 10/29/12  Yes Ripudeep Jenna Luo, MD  traMADol (ULTRAM) 50 MG tablet Take 1-2 tablets (50-100 mg total) by mouth every 8 (eight) hours as needed for pain. 10/29/12  Yes Ripudeep Jenna Luo, MD  acetaminophen-codeine (TYLENOL #3) 300-30 MG per tablet Take 1 tablet by mouth every 4 (four) hours as needed. 04/23/13   Jeanann Lewandowsky, MD  diclofenac (CATAFLAM) 50 MG tablet Take 1 tablet (50 mg total) by mouth 3 (three) times daily. 02/09/13   Reuben Likes, MD     Objective:   Filed  Vitals:   04/23/13 1412  BP: 120/77  Pulse: 78  Temp: 99 F (37.2 C)  TempSrc: Oral  Resp: 16  Height: 5\' 6"  (1.676 m)  Weight: 165 lb (74.844 kg)  SpO2: 96%    Exam General appearance : Awake, alert, not in any distress. Speech Clear. Not toxic looking HEENT: Atraumatic and Normocephalic, pupils equally reactive to light and accomodation Neck: supple, no JVD. No cervical lymphadenopathy.  Chest:Good air entry bilaterally, no added sounds  CVS: S1 S2 regular, no murmurs.  Abdomen: Bowel sounds present, Non tender and not distended with no gaurding, rigidity or rebound. Extremities: B/L Lower Ext shows no edema, both legs are warm to touch Neurology: Awake alert, and oriented X 3, CN II-XII intact, Non focal Skin:No Rash Wounds:N/A  Data Review No results found for this basename: HGBA1C     Assessment & Plan   1. Left knee pain  - acetaminophen-codeine (TYLENOL #3) 300-30 MG per tablet; Take 1 tablet by mouth every 4 (four) hours as needed.  Dispense: 60 tablet; Refill: 0 - Ambulatory referral to Orthopedic Surgery - AMB referral to sports medicine  2. Smoking Patient was counseled extensively on smoking cessation  3. Pain, dental with dental caries  - Ambulatory referral to Dentistry  Patient was counseled extensively about nutrition and exercise  Return in  about 6 months (around 10/23/2013), or if symptoms worsen or fail to improve, for Follow up Pain and comorbidities.  The patient was given clear instructions to go to ER or return to medical center if symptoms don't improve, worsen or new problems develop. The patient verbalized understanding. The patient was told to call to get lab results if they haven't heard anything in the next week.   This note has been created with Education officer, environmentalDragon speech recognition software and smart phrase technology. Any transcriptional errors are unintentional.    Jeanann Lewandowskylugbemiga Jacobs Golab, MD, MHA, FACP, [facility] and  [facility] [place], [place] [phone]   04/23/2013, 2:40 PM

## 2013-05-18 ENCOUNTER — Ambulatory Visit: Payer: Self-pay | Admitting: Sports Medicine

## 2013-05-25 ENCOUNTER — Ambulatory Visit (INDEPENDENT_AMBULATORY_CARE_PROVIDER_SITE_OTHER): Payer: No Typology Code available for payment source | Admitting: Sports Medicine

## 2013-05-25 ENCOUNTER — Encounter: Payer: Self-pay | Admitting: Sports Medicine

## 2013-05-25 VITALS — BP 109/67 | Ht 66.0 in | Wt 165.0 lb

## 2013-05-25 DIAGNOSIS — IMO0002 Reserved for concepts with insufficient information to code with codable children: Secondary | ICD-10-CM

## 2013-05-25 DIAGNOSIS — M1710 Unilateral primary osteoarthritis, unspecified knee: Secondary | ICD-10-CM

## 2013-05-25 MED ORDER — METHYLPREDNISOLONE ACETATE 40 MG/ML IJ SUSP
40.0000 mg | Freq: Once | INTRAMUSCULAR | Status: AC
  Administered 2013-05-25: 40 mg via INTRA_ARTICULAR

## 2013-05-25 MED ORDER — TRAMADOL HCL 50 MG PO TABS: 50.0000 mg | ORAL_TABLET | Freq: Three times a day (TID) | ORAL | Status: DC | PRN

## 2013-05-25 MED ORDER — MELOXICAM 15 MG PO TABS: 15.0000 mg | ORAL_TABLET | Freq: Every day | ORAL | Status: DC

## 2013-05-25 NOTE — Progress Notes (Signed)
   Subjective:    Patient ID: Timothy Li, male    DOB: 12/02/1975, 38 y.o.   MRN: [id number]  HPI chief complaint: Left knee pain  Patient comes in today with returning left knee pain. 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 He was last seen in the office in September and a cortisone injection was administered which provide him with several months of symptom relief. Pain began to return about 2 months ago. He was also given prescriptions for both the meloxicam and Ultram at that visit and he feels like they were both beneficial. His primary care physician now has him on Tylenol No. 3. He denies any recent trauma. Pain is identical in nature to what he has experienced previously.  Interim medical history reviewed    Review of Systems     Objective:   Physical Exam Well-developed, well-nourished. No acute distress  Left knee: 5 extension lag. Flexion to 100. No effusion. Slight tenderness to palpation along the medial or lateral joint lines. Once again noted is significant bony hypertrophy of the medial knee consistent with long-standing DJD. Knee is grossly stable to ligamentous exam.       Assessment & Plan:  Returning left knee pain secondary to advanced DJD status post ORIF  Patient's left knee was reinjected today with cortisone. An anterior lateral approach was utilized. Patient tolerated this without difficulty. I have refilled both his meloxicam and his tramadol to take on a when necessary basis. If cortisone injections continue to be effective we can repeat these every 6 months or so. Definitive treatment is a total knee arthroplasty but he is very young for this. Followup when necessary.  Consent obtained and verified. Time-out conducted. Noted no overlying erythema, induration, or other signs of local infection. Skin prepped in a sterile fashion. Topical analgesic spray: Ethyl chloride. Joint: left knee Needle: 22g 1.5  inch Completed without difficulty. Meds: 3cc 1% xylocaine, 1cc (40mg ) depomedrol  Advised to call if fevers/chills, erythema, induration, drainage, or persistent bleeding.

## 2013-06-23 ENCOUNTER — Other Ambulatory Visit: Payer: Self-pay | Admitting: *Deleted

## 2013-06-23 DIAGNOSIS — M25562 Pain in left knee: Secondary | ICD-10-CM

## 2013-06-24 MED ORDER — ACETAMINOPHEN-CODEINE #3 300-30 MG PO TABS: 1.0000 | ORAL_TABLET | ORAL | Status: DC | PRN

## 2013-10-15 ENCOUNTER — Ambulatory Visit: Payer: Self-pay | Admitting: Internal Medicine

## 2013-11-05 ENCOUNTER — Ambulatory Visit: Payer: Self-pay | Attending: Internal Medicine

## 2013-12-24 ENCOUNTER — Ambulatory Visit: Payer: Self-pay | Attending: Internal Medicine | Admitting: Internal Medicine

## 2013-12-24 ENCOUNTER — Encounter: Payer: Self-pay | Admitting: Internal Medicine

## 2013-12-24 VITALS — BP 128/81 | HR 85 | Temp 98.8°F | Resp 18 | Ht 65.0 in | Wt 165.0 lb

## 2013-12-24 DIAGNOSIS — M1710 Unilateral primary osteoarthritis, unspecified knee: Secondary | ICD-10-CM

## 2013-12-24 DIAGNOSIS — M6740 Ganglion, unspecified site: Secondary | ICD-10-CM

## 2013-12-24 DIAGNOSIS — M25562 Pain in left knee: Secondary | ICD-10-CM

## 2013-12-24 DIAGNOSIS — M129 Arthropathy, unspecified: Secondary | ICD-10-CM | POA: Insufficient documentation

## 2013-12-24 DIAGNOSIS — L700 Acne vulgaris: Secondary | ICD-10-CM

## 2013-12-24 MED ORDER — AZELAIC ACID 20 % EX CREA: TOPICAL_CREAM | Freq: Two times a day (BID) | CUTANEOUS | Status: DC

## 2013-12-24 MED ORDER — TRAMADOL HCL 50 MG PO TABS: 50.0000 mg | ORAL_TABLET | Freq: Two times a day (BID) | ORAL | Status: DC | PRN

## 2013-12-24 NOTE — Progress Notes (Signed)
Patient ID: Timothy Li, male   DOB: 10/24/1975, 38 y.o.   MRN: [id number]   Timothy Li, is a 38 y.o. male  [id number]  NFA:[id number]  DOB - 08/13/1975  Chief Complaint  Patient presents with  . Follow-up  . Dental Pain  . Knee Pain        Subjective:   Timothy Li is a 38 y.o. male here today for a follow up with chronic left knee pain. Pt is following up with Sports Med for knee injections. Requesting refill on Tramadol and ATb for small right mouth abscess. States he has 6 month dentist appointment coming up. Denies drainage or fevers. Smokes 1 pack/day cigarettes. Requesting smoking cessation info. Patient has No headache, No chest pain, No abdominal pain - No Nausea, No new weakness tingling or numbness, No Cough - SOB.  Problem  Acne Comedone  Ganglion Cyst    ALLERGIES: No Known Allergies  PAST MEDICAL HISTORY: Past Medical History  Diagnosis Date  . Arthritis     MEDICATIONS AT HOME: Prior to Admission medications   Medication Sig Start Date End Date Taking? Authorizing Provider  acetaminophen-codeine (TYLENOL #3) 300-30 MG per tablet Take 1 tablet by mouth every 4 (four) hours as needed. Patient not taking: Reported on 12/24/2013    Quentin Angstlugbemiga E Khary Schaben, MD  azelaic acid (AZELEX) 20 % cream Apply topically 2 (two) times daily. After skin is thoroughly washed and patted dry, gently but thoroughly massage a thin film of azelaic acid cream into the affected area twice daily, in the morning and evening. 12/24/13   Quentin Angstlugbemiga E Irish Breisch, MD  clindamycin (CLEOCIN) 300 MG capsule Take 1 capsule (300 mg total) by mouth 3 (three) times daily. Patient not taking: Reported on 12/24/2013 02/08/13   Linna HoffJames D Kindl, MD  diclofenac (CATAFLAM) 50 MG tablet Take 1 tablet (50 mg total) by mouth 3 (three) times daily. Patient not taking: Reported on 12/24/2013 02/09/13   Reuben Likesavid C Keller, MD  meloxicam (MOBIC) 15 MG tablet Take 1 tablet (15 mg total) by mouth daily. Patient  not taking: Reported on 12/24/2013 05/25/13   Ralene Corkimothy R Draper, DO  traMADol (ULTRAM) 50 MG tablet Take 1 tablet (50 mg total) by mouth every 12 (twelve) hours as needed. 12/24/13   Quentin Angstlugbemiga E Champayne Kocian, MD     Objective:   Filed Vitals:   12/24/13 1641  BP: 128/81  Pulse: 85  Temp: 98.8 F (37.1 C)  TempSrc: Oral  Resp: 18  Height: 5\' 5"  (1.651 m)  Weight: 165 lb (74.844 kg)  SpO2: 98%    Exam General appearance : Awake, alert, not in any distress. Speech Clear. Not toxic looking HEENT: Atraumatic and Normocephalic, pupils equally reactive to light and accomodation Neck: supple, no JVD. No cervical lymphadenopathy.  Chest:Good air entry bilaterally, no added sounds  CVS: S1 S2 regular, no murmurs.  Abdomen: Bowel sounds present, Non tender and not distended with no gaurding, rigidity or rebound. Extremities: B/L Lower Ext shows no edema, both legs are warm to touch Neurology: Awake alert, and oriented X 3, CN II-XII intact, Non focal Ganglion Cyst Wounds:N/A  Data Review No results found for: HGBA1C   Assessment & Plan   1. Left knee pain  - Ambulatory referral to Sports Medicine  2. Arthritis of knee  - traMADol (ULTRAM) 50 MG tablet; Take 1 tablet (50 mg total) by mouth every 12 (twelve) hours as needed.  Dispense: 60 tablet; Refill: 3 - Ambulatory referral to Sports Medicine  3. Acne comedone  - azelaic acid (AZELEX) 20 % cream; Apply topically 2 (two) times daily. After skin is thoroughly washed and patted dry, gently but thoroughly massage a thin film of azelaic acid cream into the affected area twice daily, in the morning and evening.  Dispense: 50 g; Refill: 3  4. Ganglion cyst  - Ambulatory referral to General Surgery  Timothy Li was counseled on the dangers of tobacco use, and was advised to quit. Reviewed strategies to maximize success, including removing cigarettes and smoking materials from environment, stress management and support of  family/friends.   Return in about 1 year (around 12/25/2014) for Routine Follow Up, Annual Physical.  The patient was given clear instructions to go to ER or return to medical center if symptoms don't improve, worsen or new problems develop. The patient verbalized understanding. The patient was told to call to get lab results if they haven't heard anything in the next week.   This note has been created with Education officer, environmentalDragon speech recognition software and smart phrase technology. Any transcriptional errors are unintentional.    Jeanann LewandowskyJEGEDE, Timothy Diefenderfer, MD, MHA, FACP, FAAP [facility] [place], [place] [phone]   12/24/2013, 5:23 PM

## 2013-12-24 NOTE — Progress Notes (Signed)
Pt here to f/u with chronic left knee pain with pain management Pt is following Sports Med for knee injections Requesting refill on Tramadol and ATb for small right mouth abscess States he has 6 month dentist appointment coming up Denies drainage or fevers Smokes 1 pack/day cigarettes Requesting smoking cessation info

## 2014-01-11 ENCOUNTER — Ambulatory Visit (INDEPENDENT_AMBULATORY_CARE_PROVIDER_SITE_OTHER): Payer: Self-pay | Admitting: Sports Medicine

## 2014-01-11 ENCOUNTER — Encounter: Payer: Self-pay | Admitting: Sports Medicine

## 2014-01-11 VITALS — BP 111/65 | HR 59 | Ht 66.0 in | Wt 170.0 lb

## 2014-01-11 DIAGNOSIS — M129 Arthropathy, unspecified: Secondary | ICD-10-CM

## 2014-01-11 DIAGNOSIS — M1710 Unilateral primary osteoarthritis, unspecified knee: Secondary | ICD-10-CM

## 2014-01-11 MED ORDER — METHYLPREDNISOLONE ACETATE 40 MG/ML IJ SUSP
40.0000 mg | Freq: Once | INTRAMUSCULAR | Status: AC
  Administered 2014-01-11: 40 mg via INTRA_ARTICULAR

## 2014-01-11 NOTE — Progress Notes (Signed)
   Subjective:    Patient ID: Timothy Li, male    DOB: 11/04/1975, 38 y.o.   MRN: [id number]  HPI   Left knee arthritis: Patient presents to sports medicine clinic for follow-up to left knee arthritis, with pain. He has a history of advanced posttraumatic DJD in his knee. He received a cortisone injection in May, with good relief. He has been prescribed tramadol, and feels that it is helpful but does not take away his pain. He denies any recent trauma. Pain he describes today is the same as it has been in the past. His tramadol prescription was recently refilled 3 from his PCP.   current everyday smoker Past Medical History  Diagnosis Date  . Arthritis     No Known Allergies  Review of Systems Per hpi    Objective:   Physical Exam BP 111/65 mmHg  Pulse 59  Ht 5\' 6"  (1.676 m)  Wt 170 lb (77.111 kg)  BMI 27.45 kg/m2 Gen: African-American male, well-developed, well-nourished, no acute distress, alert, oriented 3.   left knee: No erythema, no edema, no soft tissue swelling, no effusions present. Bony hypertrophy of the medial knee.  mild tenderness to palpation inferior patella.  no jointline tenderness. extension lag 5 degrees. Flexion to 100.  no ligament laxity noted. Negative Lachman's. Neurovascular intact distally.    Assessment & Plan:  Timothy Li is a 38 y.o. male present for follow-up on left knee DJD secondary to trauma/ORIF, with repeat injection today.  Patient was amendable to reinjection today. Anterior lateral approach was utilized. Patient tolerated this without difficulty. Prior injections have been well tolerated with good benefit for a few months, we can repeat injections every 6 months if needed.  Follow up as necessary.   Consent obtained and verified. Time out conducted. No overlying erythema, induration or signs of local infection. Skin prepped in a sterile fashion. Topical analgesic spray: Ethyl chloride. Joint: Left knee Approached in typical  fashion with: Anterior lateral approach Completed without difficulty Meds: 3 mL 1% Xylocaine. 1 mL (40 mg) Depo-Medrol Needle: Age 58.5 inch needle. Aftercare instructions and Red flags advised.  Patient seen and evaluated with the resident. I agree with the above plan of care.

## 2014-03-22 ENCOUNTER — Telehealth: Payer: Self-pay | Admitting: Emergency Medicine

## 2014-03-22 NOTE — Telephone Encounter (Signed)
Patient can pick up DMV paperwork.

## 2014-04-30 IMAGING — CR DG KNEE 1-2V*L*
2 series · 2 of 2 positions shown · non-contrast
Comparison: 09/12/2012

CLINICAL DATA: Left knee pain and swelling, surgery in [REDACTED]

EXAM:
LEFT KNEE - 1-2 VIEW

[w knee ap left]
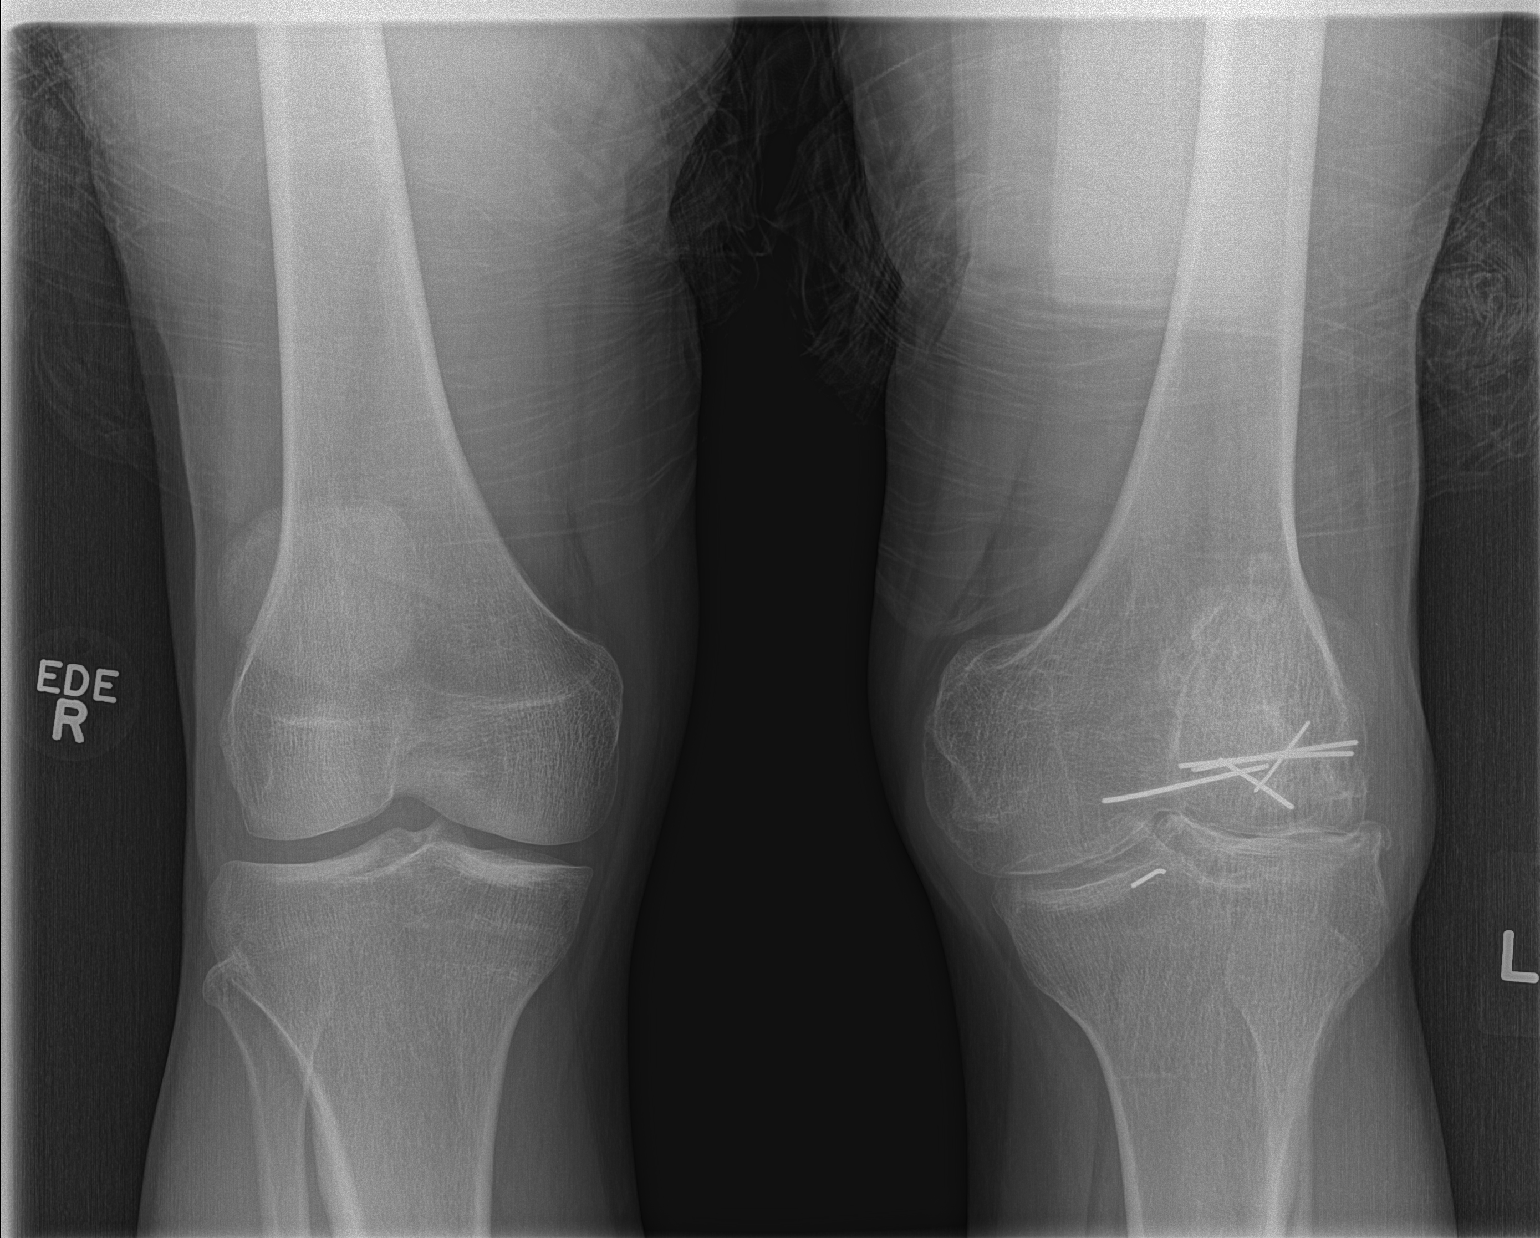

[x knee patella left]
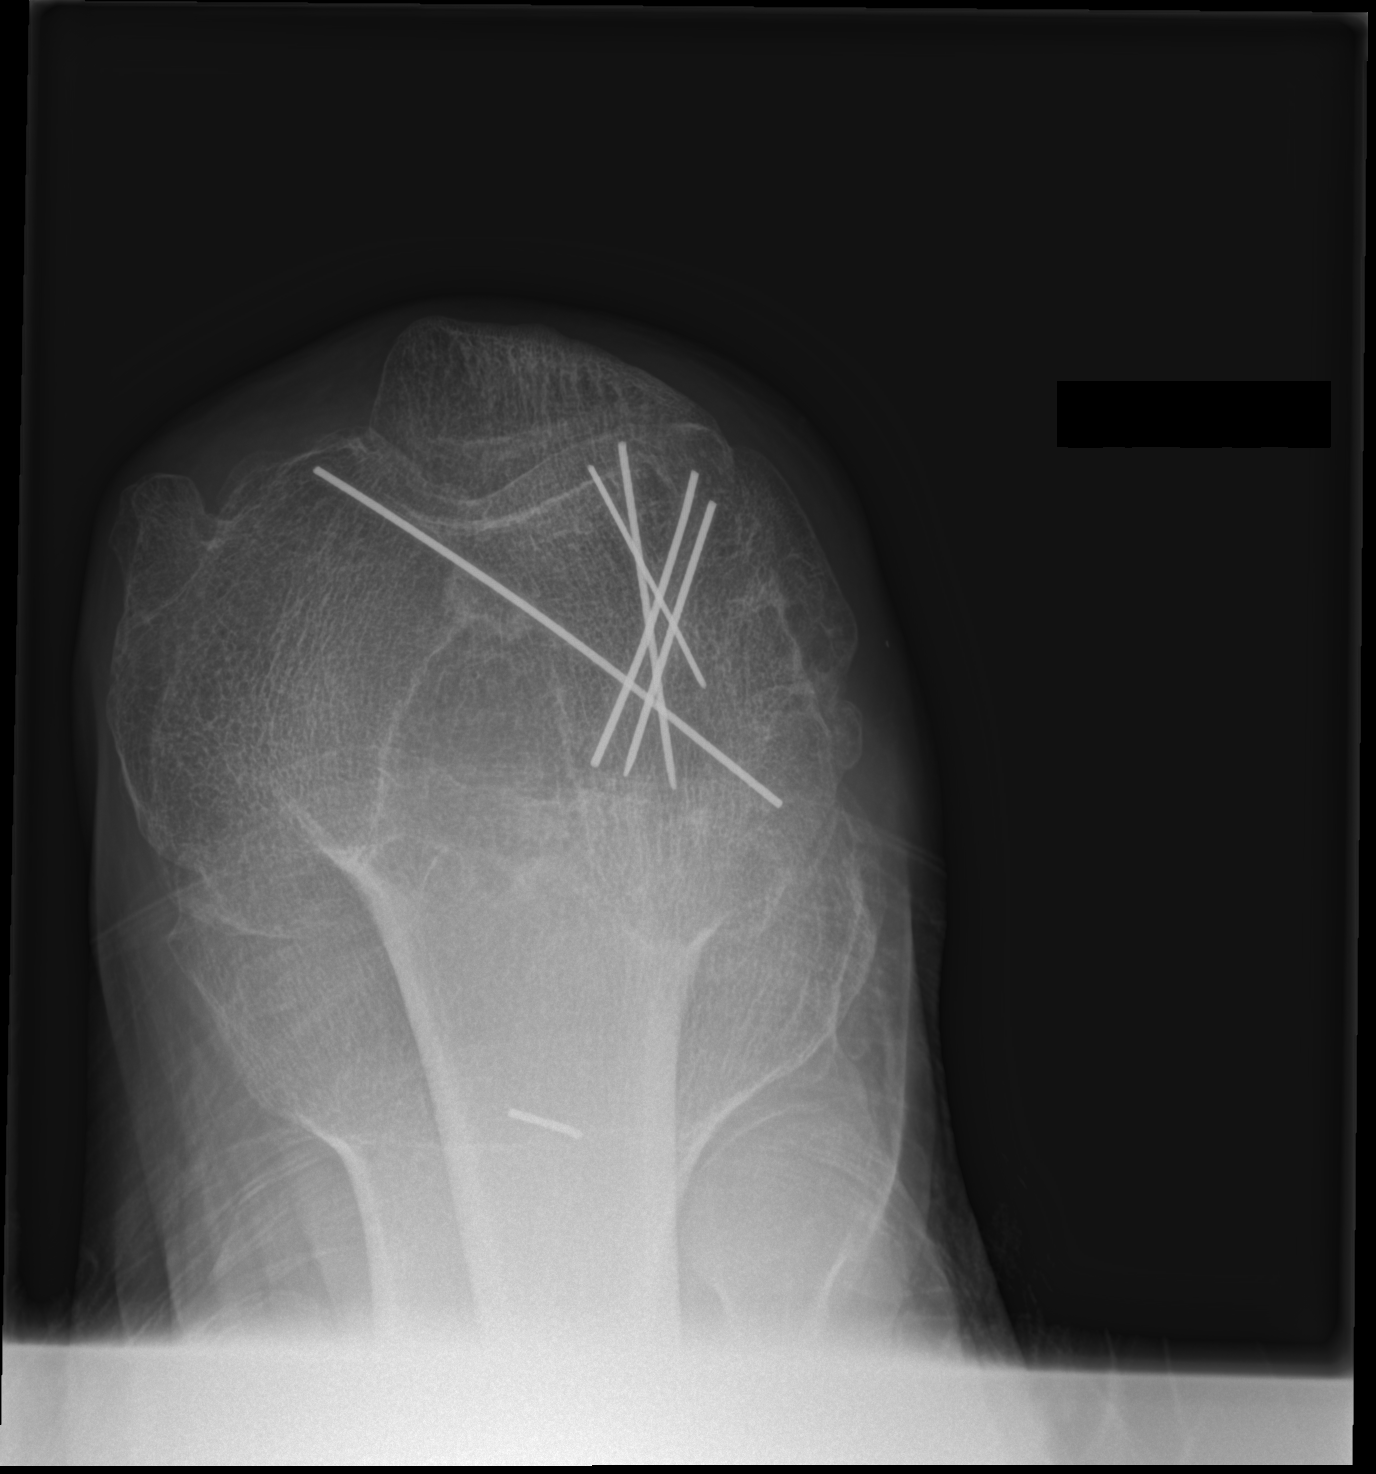

[2 of 2 positions shown; findings below may reference images not displayed]

FINDINGS: Standing AP and sunrise views.

Osseous demineralization.

Diffuse joint space narrowing and marginal spur formation.

Multiple wires identified at the lateral femoral condyle and the
proximal central tibia post ORIF.

No acute fracture, dislocation or bone destruction.

Lateral subluxation of tibia.
IMPRESSION: Postsurgical and advanced osteoarthritic changes of the left knee.

Little interval change.

## 2014-07-05 ENCOUNTER — Other Ambulatory Visit: Payer: Self-pay | Admitting: Internal Medicine

## 2014-07-06 ENCOUNTER — Ambulatory Visit: Payer: Self-pay | Attending: Internal Medicine

## 2014-07-20 ENCOUNTER — Ambulatory Visit: Payer: Self-pay | Attending: Internal Medicine

## 2014-08-04 ENCOUNTER — Other Ambulatory Visit: Payer: Self-pay | Admitting: General Practice

## 2014-08-04 NOTE — Telephone Encounter (Signed)
Patient presents to clinic to request medication refill for traMADol (ULTRAM) 50 MG tablet

## 2014-09-01 NOTE — Telephone Encounter (Signed)
Nurse called patient, patient verified date of birth. Patient requesting tramadol for arthritis of knee.  Patient aware of need for appointment and can not refill medication at this time.  Patient transferred to front office staff to schedule appointment.  Nurse also asked patient about seeing sports medicine doctor.  Patient wants to see Dr. Hyman Hopes first.  Patient voices understanding and has no further questions at this time.

## 2014-09-06 ENCOUNTER — Ambulatory Visit: Payer: Self-pay | Admitting: Internal Medicine

## 2014-10-05 ENCOUNTER — Telehealth: Payer: Self-pay | Admitting: Internal Medicine

## 2014-10-05 NOTE — Telephone Encounter (Signed)
Patient came into facility to request a med refill for traMADol (ULTRAM) 50 MG tablet. Please f/u with pt. °

## 2014-10-06 ENCOUNTER — Other Ambulatory Visit: Payer: Self-pay | Admitting: *Deleted

## 2014-10-06 DIAGNOSIS — M1710 Unilateral primary osteoarthritis, unspecified knee: Secondary | ICD-10-CM

## 2014-10-06 MED ORDER — MELOXICAM 15 MG PO TABS: 15.0000 mg | ORAL_TABLET | Freq: Every day | ORAL | Status: DC

## 2014-10-06 MED ORDER — TRAMADOL HCL 50 MG PO TABS: 50.0000 mg | ORAL_TABLET | Freq: Two times a day (BID) | ORAL | Status: DC | PRN

## 2014-10-14 ENCOUNTER — Ambulatory Visit: Payer: Self-pay | Admitting: Sports Medicine

## 2014-10-25 ENCOUNTER — Ambulatory Visit (INDEPENDENT_AMBULATORY_CARE_PROVIDER_SITE_OTHER): Payer: Self-pay | Admitting: Sports Medicine

## 2014-10-25 ENCOUNTER — Encounter: Payer: Self-pay | Admitting: Sports Medicine

## 2014-10-25 VITALS — BP 130/62 | Ht 66.0 in | Wt 175.0 lb

## 2014-10-25 DIAGNOSIS — M1710 Unilateral primary osteoarthritis, unspecified knee: Secondary | ICD-10-CM

## 2014-10-25 DIAGNOSIS — M25562 Pain in left knee: Secondary | ICD-10-CM

## 2014-10-25 DIAGNOSIS — M129 Arthropathy, unspecified: Secondary | ICD-10-CM

## 2014-10-25 MED ORDER — METHYLPREDNISOLONE ACETATE 40 MG/ML IJ SUSP
40.0000 mg | Freq: Once | INTRAMUSCULAR | Status: AC
  Administered 2014-10-25: 40 mg via INTRA_ARTICULAR

## 2014-10-25 NOTE — Progress Notes (Signed)
   Subjective:    Patient ID: Timothy Li, male    DOB: [birth year], 39 y.o.   MRN: [id number]  HPI chief complaint: Left knee pain  Patient comes in today requesting a repeat cortisone injection into his left knee. He has a well-documented history of advanced posttraumatic left knee DJD. He does well with periodic cortisone injections but is growing frustrated by his disability. Although he is young for total knee arthroplasty he would like to discuss this with one of the orthopedic surgeons.  Intermedic a history reviewed Medications reviewed Allergies reviewed    Review of Systems    as above Objective:   Physical Exam Well-developed, well-nourished. No acute distress. Vital signs reviewed  Left knee: Range of motion shows a 5-10 extension lag with flexion to 100. No effusion. Marked bony hypertrophy. Minimal tenderness to palpation. No effusion. Knee is grossly stable to ligamentous exam. Skin is intact with obvious surgical scar. Neurovascular intact distally. Walking with a limp.       Assessment & Plan:  Severe post traumatic left knee DJD  Left knee is reinjected today with cortisone. This is done using an anterior lateral approach after risks and benefits were explained. Patient tolerated this without difficulty. Although he is young for total knee arthroplasty he would like to discuss this option with one of the orthopedists. Therefore, I will refer him to Dr.Xu to discuss this. Follow-up with me as needed.  Consent obtained and verified. Time-out conducted. Noted no overlying erythema, induration, or other signs of local infection. Skin prepped in a sterile fashion. Topical analgesic spray: Ethyl chloride. Joint: left knee Needle: 25g 1.5 inch Completed without difficulty. Meds: 3cc 1%xylocaine, 1cc ( ) depomedrol  Advised to call if fevers/chills, erythema, induration, drainage, or persistent bleeding.

## 2015-01-20 MED FILL — traMADol HCL 50 MG TABS: 50 | 30 days supply | Qty: 60 | Fill #2

## 2015-01-20 MED FILL — MELOXICAM 15 MG TABLET: 15 | 30 days supply | Qty: 30 | Fill #2

## 2015-01-26 ENCOUNTER — Encounter (HOSPITAL_COMMUNITY): Payer: Self-pay | Admitting: Emergency Medicine

## 2015-01-26 ENCOUNTER — Emergency Department (INDEPENDENT_AMBULATORY_CARE_PROVIDER_SITE_OTHER)
Admission: EM | Admit: 2015-01-26 | Discharge: 2015-01-26 | Disposition: A | Discharge status: Nursing Home (Includes ICF) | Payer: Self-pay | Source: Home / Self Care | Attending: Emergency Medicine | Admitting: Emergency Medicine

## 2015-01-26 ENCOUNTER — Emergency Department (HOSPITAL_COMMUNITY)
Admission: EM | Admit: 2015-01-26 | Discharge: 2015-01-26 | Disposition: A | Discharge status: Home / Self Care | Payer: 59 | Attending: Emergency Medicine | Admitting: Emergency Medicine

## 2015-01-26 DIAGNOSIS — Z23 Encounter for immunization: Secondary | ICD-10-CM

## 2015-01-26 DIAGNOSIS — L089 Local infection of the skin and subcutaneous tissue, unspecified: Secondary | ICD-10-CM

## 2015-01-26 DIAGNOSIS — F17200 Nicotine dependence, unspecified, uncomplicated: Secondary | ICD-10-CM | POA: Diagnosis not present

## 2015-01-26 DIAGNOSIS — Z8614 Personal history of Methicillin resistant Staphylococcus aureus infection: Secondary | ICD-10-CM | POA: Diagnosis not present

## 2015-01-26 DIAGNOSIS — M79645 Pain in left finger(s): Secondary | ICD-10-CM | POA: Diagnosis present

## 2015-01-26 DIAGNOSIS — L02512 Cutaneous abscess of left hand: Secondary | ICD-10-CM | POA: Diagnosis not present

## 2015-01-26 DIAGNOSIS — L0291 Cutaneous abscess, unspecified: Secondary | ICD-10-CM

## 2015-01-26 LAB — COMPREHENSIVE METABOLIC PANEL
ALBUMIN: 4 g/dL (ref 3.5–5.0)
ALT: 14 U/L — ABNORMAL LOW (ref 17–63)
ANION GAP: 10 (ref 5–15)
AST: 14 U/L — ABNORMAL LOW (ref 15–41)
Alkaline Phosphatase: 65 U/L (ref 38–126)
BILIRUBIN TOTAL: 0.5 mg/dL (ref 0.3–1.2)
BUN: 7 mg/dL (ref 6–20)
CO2: 25 mmol/L (ref 22–32)
Calcium: 9.3 mg/dL (ref 8.9–10.3)
Chloride: 105 mmol/L (ref 101–111)
Creatinine, Ser: 0.67 mg/dL (ref 0.61–1.24)
GFR calc Af Amer: >60 mL/min (ref >60)
Glucose, Bld: 90 mg/dL (ref 65–99)
POTASSIUM: 3.7 mmol/L (ref 3.5–5.1)
Sodium: 140 mmol/L (ref 135–145)
TOTAL PROTEIN: 6.7 g/dL (ref 6.5–8.1)

## 2015-01-26 LAB — CBC
HEMATOCRIT: 38.5 % — AB (ref 39.0–52.0)
HEMOGLOBIN: 12.7 g/dL — AB (ref 13.0–17.0)
MCH: 30.6 pg (ref 26.0–34.0)
MCHC: 33 g/dL (ref 30.0–36.0)
MCV: 92.8 fL (ref 78.0–100.0)
Platelets: 277 10*3/uL (ref 150–400)
RBC: 4.15 MIL/uL — ABNORMAL LOW (ref 4.22–5.81)
RDW: 13.9 % (ref 11.5–15.5)
WBC: 15.2 10*3/uL — AB (ref 4.0–10.5)

## 2015-01-26 LAB — I-STAT CG4 LACTIC ACID, ED: Lactic Acid, Venous: 0.79 mmol/L (ref 0.5–2.0)

## 2015-01-26 MED ORDER — OXYCODONE-ACETAMINOPHEN 5-325 MG PO TABS: 1.0000 | ORAL_TABLET | ORAL | Status: DC | PRN

## 2015-01-26 MED ORDER — CLINDAMYCIN HCL 300 MG PO CAPS: 300.0000 mg | ORAL_CAPSULE | Freq: Three times a day (TID) | ORAL | Status: DC

## 2015-01-26 MED ORDER — TETANUS-DIPHTH-ACELL PERTUSSIS 5-2.5-18.5 LF-MCG/0.5 IM SUSP
INTRAMUSCULAR | Status: AC
  Filled 2015-01-26: qty 0.5

## 2015-01-26 MED ORDER — LIDOCAINE HCL (PF) 1 % IJ SOLN
10.0000 mL | Freq: Once | INTRAMUSCULAR | Status: AC
Start: 2015-01-26 — End: 2015-01-26
  Administered 2015-01-26: 10 mL

## 2015-01-26 MED ORDER — LIDOCAINE HCL (PF) 1 % IJ SOLN
INTRAMUSCULAR | Status: AC
  Administered 2015-01-26: 10 mL
  Filled 2015-01-26: qty 10

## 2015-01-26 MED ORDER — TETANUS-DIPHTH-ACELL PERTUSSIS 5-2.5-18.5 LF-MCG/0.5 IM SUSP
0.5000 mL | Freq: Once | INTRAMUSCULAR | Status: AC
  Administered 2015-01-26: 0.5 mL via INTRAMUSCULAR

## 2015-01-26 MED FILL — CLINDAMYCIN HCL 300 MG CAP: 300 | 30 days supply | Qty: 30 | Fill #0

## 2015-01-26 NOTE — ED Provider Notes (Signed)
[facility] instructed to come to the emergency room where Dr. Orlan Leavensrtman would personally evaluate the patient. Patient was placed in a room prior to my interactions Dr. Orlan Leavensrtman arrived and proceeded with his interventions. Please see full H&P from [facility] and Dr. Bari Edwardrtman's note.   Eyvonne MechanicJeffrey Dakota Vanwart, PA-C 01/26/15 [id number]  Mancel BaleElliott Wentz, MD 01/27/15 [phone]

## 2015-01-26 NOTE — ED Notes (Signed)
Pt has swelling and redness for 1 week. To left index finger. Sent over from urgent care to have finger evaluated by Dr.ortman. Pt advised to remain NPO until seen by dr. Orlan Leavensrtman.

## 2015-01-26 NOTE — ED Notes (Signed)
Reports abscess to left index finger.  Reports symptoms for a week.  Patient has been squeezing finger and finger is draining.  Patient has been draining per patient.  Patient has been using neosporin on finger.

## 2015-01-26 NOTE — ED Provider Notes (Addendum)
HPI  SUBJECTIVE:  Timothy Li is a left-handed [age] y.o. male who presents with pain, swelling, erythema along his left index finger for a week. States that it started off as a "bump" which then came to a head. He drained it with a safety pin with improvement in pain and swelling. He states that it "started coming back" 4 days ago and got acutely worse last night. Reports constant, dull, throbbing pain which is worse with moving his finger, palpation, no alleviating factors. He has been "squeezing on it". He denies nausea, vomiting, fever, trauma to the hand, insect bite to the area. No antipyretic in past 4-6 hours. Past medical history of right knee replacement. No history of diabetes, hypertension, HIV, immunocompromise, artificial heart valve, history of MRSA. Patient is a smoker.    Past Medical History  Diagnosis Date  . Arthritis     Past Surgical History  Procedure Laterality Date  . Knee surgery Left   . Wrist surgery      No family history on file.  Social History  Substance Use Topics  . Smoking status: Current Every Day Smoker  . Smokeless tobacco: None  . Alcohol Use: Yes     Current facility-administered medications:  .  Tdap (BOOSTRIX) injection 0.5 mL, 0.5 mL, Intramuscular, Once, Domenick GongAshley Dorothy Polhemus, MD  Current outpatient prescriptions:  .  acetaminophen-codeine (TYLENOL #3) 300-30 MG per tablet, Take 1 tablet by mouth every 4 (four) hours as needed. (Patient not taking: Reported on 12/24/2013), Disp: 60 tablet, Rfl: 0 .  azelaic acid (AZELEX) 20 % cream, Apply topically 2 (two) times daily. After skin is thoroughly washed and patted dry, gently but thoroughly massage a thin film of azelaic acid cream into the affected area twice daily, in the morning and evening., Disp: 50 g, Rfl: 3 .  diclofenac (CATAFLAM) 50 MG tablet, Take 1 tablet (50 mg total) by mouth 3 (three) times daily. (Patient not taking: Reported on 12/24/2013), Disp: 30 tablet, Rfl: 0 .  traMADol  (ULTRAM) 50 MG tablet, Take 1 tablet (50 mg total) by mouth every 12 (twelve) hours as needed., Disp: 60 tablet, Rfl: 3  No Known Allergies   ROS  As noted in HPI.   Physical Exam  BP 125/72 mmHg  Pulse 68  Temp(Src) 98.7 F (37.1 C) (Oral)  Resp 12  SpO2 100%  Constitutional: Well developed, well nourished, no acute distress Eyes:  EOMI, conjunctiva normal bilaterally HENT: Normocephalic, atraumatic,mucus membranes moist Respiratory: Normal inspiratory effort Cardiovascular: Normal rate GI: nondistended skin: No rash, skin intact Musculoskeletal: Tenderness, erythema, swelling left index finger from middle phalanx to MCP joint. 2 point discrimination intact. Tenderness along the flexor tendons. Flexion and extension intact at the DIP, PIP. Positive expressible purulent drainage from open wound on medial aspect middle phalanx of left index finger.  Neurologic: Alert & oriented x 3, no focal neuro deficits Psychiatric: Speech and behavior appropriate   ED Course   Medications  Tdap (BOOSTRIX) injection 0.5 mL (not administered)    No orders of the defined types were placed in this encounter.    No results found for this or any previous visit (from the past 24 hour(s)). No results found.         ED Clinical Impression  Finger infection   ED Assessment/Plan  Updating tetanus. Discussed with Dr. Melvyn Novasrtmann,  hand surgeon on call. Concern for flexor tenosynovitis. Advised to keep patient nothing by mouth and to send him down to the ED for evaluation.  Plan to have surgical exploration tonight  Discussed rationale transfer the patient, plan keep nothing by mouth, and evaluation by hand in the ED today. Emphasized importance of going to the ER immediately. Patient agrees with plan.  *This clinic note was created using Dragon dictation software. Therefore, there may be occasional mistakes despite careful proofreading.  ?   Domenick Gong, MD 01/26/15  1415  Domenick Gong, MD 01/26/15 1426

## 2015-01-26 NOTE — Discharge Instructions (Signed)
Go to [facility]. I have talked with the hand surgeon, Dr. Melvyn Novasrtmann. He will come in and see you in the ED.  You may need to have surgery tonight. Do not have anything to eat or drink until you're seen by Dr. Melvyn Novasrtmann.

## 2015-01-26 NOTE — Discharge Instructions (Signed)
KEEP BANDAGE CLEAN AND DRY CALL OFFICE FOR F/U APPT [phone] in 6 days KEEP HAND ELEVATED ABOVE HEART OK TO APPLY ICE TO OPERATIVE AREA CONTACT OFFICE IF ANY WORSENING PAIN OR CONCERNS.  

## 2015-01-26 NOTE — Consult Note (Signed)
Reason for Consult:left index finger infection Referring Physician: dr. Corrie Dandy is an 40 y.o. male.  HPI:  Timothy Li is a left-handed 40 y.o. male who presents with pain, swelling, erythema along his left index finger for a week. States that it started off as a "bump" which then came to a head. He drained it with a safety pin with improvement in pain and swelling. He states that it "started coming back" 4 days ago and got acutely worse last night. Reports constant, dull, throbbing pain which is worse with moving his finger, palpation, no alleviating factors. He has been "squeezing on it". He denies nausea, vomiting, fever, trauma to the hand, insect bite to the area. No antipyretic in past 4-6 hours. Past medical history of right knee replacement. No history of diabetes, hypertension, HIV, immunocompromise, artificial heart valve, history of MRSA. Patient is a smoker.  Past Medical History  Diagnosis Date  . Arthritis     Past Surgical History  Procedure Laterality Date  . Knee surgery Left   . Wrist surgery      No family history on file.  Social History:  reports that he has been smoking.  He does not have any smokeless tobacco history on file. He reports that he drinks alcohol. He reports that he uses illicit drugs (Marijuana).  Allergies: No Known Allergies  Medications: I have reviewed the patient's current medications.  Results for orders placed or performed during the hospital encounter of 01/26/15 (from the past 48 hour(s))  Comprehensive metabolic panel     Status: Abnormal   Collection Time: 01/26/15  3:15 PM  Result Value Ref Range   Sodium 140 135 - 145 mmol/L   Potassium 3.7 3.5 - 5.1 mmol/L   Chloride 105 101 - 111 mmol/L   CO2 25 22 - 32 mmol/L   Glucose, Bld 90 65 - 99 mg/dL   BUN 7 6 - 20 mg/dL   Creatinine, Ser 0.67 0.61 - 1.24 mg/dL   Calcium 9.3 8.9 - 10.3 mg/dL   Total Protein 6.7 6.5 - 8.1 g/dL   Albumin 4.0 3.5 - 5.0 g/dL   AST  14 (L) 15 - 41 U/L   ALT 14 (L) 17 - 63 U/L   Alkaline Phosphatase 65 38 - 126 U/L   Total Bilirubin 0.5 0.3 - 1.2 mg/dL   GFR calc non Af Amer >60 >60 mL/min   GFR calc Af Amer >60 >60 mL/min    Comment: (NOTE) The eGFR has been calculated using the CKD EPI equation. This calculation has not been validated in all clinical situations. eGFR's persistently <60 mL/min signify possible Chronic Kidney Disease.    Anion gap 10 5 - 15  CBC     Status: Abnormal   Collection Time: 01/26/15  3:15 PM  Result Value Ref Range   WBC 15.2 (H) 4.0 - 10.5 K/uL   RBC 4.15 (L) 4.22 - 5.81 MIL/uL   Hemoglobin 12.7 (L) 13.0 - 17.0 g/dL   HCT 38.5 (L) 39.0 - 52.0 %   MCV 92.8 78.0 - 100.0 fL   MCH 30.6 26.0 - 34.0 pg   MCHC 33.0 30.0 - 36.0 g/dL   RDW 13.9 11.5 - 15.5 %   Platelets 277 150 - 400 K/uL  I-Stat CG4 Lactic Acid, ED  (not at [facility])     Status: None   Collection Time: 01/26/15  3:28 PM  Result Value Ref Range   Lactic Acid, Venous 0.79 0.5 -  2.0 mmol/L    No results found.  ROSNO RECENT ILLNESSES OR HOSPITALIZATIONS Blood pressure 115/96, pulse 77, temperature 99.5 F (37.5 C), temperature source Oral, resp. rate 16, height '5\' 5"'  (1.651 m), weight 79.379 kg (175 lb), SpO2 96 %. Physical Exam  General Appearance:  Alert, cooperative, no distress, appears stated age  Head:  Normocephalic, without obvious abnormality, atraumatic  Eyes:  Pupils equal, conjunctiva/corneas clear,         Throat: Lips, mucosa, and tongue normal; teeth and gums normal  Neck: No visible masses     Lungs:   respirations unlabored  Chest Wall:  No tenderness or deformity  Heart:  Regular rate and rhythm,  Abdomen:   Soft, non-tender,         Extremities: LEFT INDEX FINGER: DRAINING WOUND OVER ULNAR ASPECT OVER MIDDLE PHALANX, MODERATE SWELLING FINGER WARM WELL PERFUSED NO WOUNDS TO LONG/RING/SMALL  Pulses: 2+ and symmetric  Skin: Skin color, texture, turgor normal, no rashes or lesions      Neurologic: Normal    Assessment/Plan: LEFT INDEX FINGER MASS WITH DRAINING WOUND  LEFT INDEX FINGER INCISION AND DRAINAGE PERFORMED AT BEDSIDE, PT HAD INCLUSION CYST, DEEP  MASS REMOVED AT BEDSIDE AND LOCAL INCISION AND DRAINAGE, WOUND CULTURES TAKEN, PATIENT TOLERATED PROCEDURE WELL WOUND DRESSED AND LOOSELY CLOSED, FINGER WARM WELL PERFUSED AFTER PROCEDURE WOUND STERILELY DRESSED  PLAN: ORAL ABX F/U IN OFFICE IN 6 DAYS KEEP BANDAGE CLEAN AND DRY DO NOT REMOVE DRESSING CLINDAMYCIN AND PERCOCET FOR MEDICATIONS   Linna Hoff 01/26/2015, 5:27 PM

## 2015-01-26 NOTE — ED Notes (Signed)
Soaking left index finger in saline and betadine

## 2015-01-29 ENCOUNTER — Emergency Department (HOSPITAL_COMMUNITY): Payer: 59 | Admitting: Anesthesiology

## 2015-01-29 ENCOUNTER — Encounter (HOSPITAL_COMMUNITY): Payer: Self-pay | Admitting: Emergency Medicine

## 2015-01-29 ENCOUNTER — Observation Stay (HOSPITAL_COMMUNITY)
Admission: EM | Admit: 2015-01-29 | Discharge: 2015-01-29 | Disposition: A | Discharge status: Home / Self Care | Payer: 59 | Attending: Orthopedic Surgery | Admitting: Orthopedic Surgery

## 2015-01-29 ENCOUNTER — Encounter (HOSPITAL_COMMUNITY)
Admission: EM | Disposition: A | Discharge status: Home / Self Care | Payer: Self-pay | Source: Home / Self Care | Attending: Emergency Medicine

## 2015-01-29 DIAGNOSIS — F17210 Nicotine dependence, cigarettes, uncomplicated: Secondary | ICD-10-CM | POA: Diagnosis not present

## 2015-01-29 DIAGNOSIS — L03012 Cellulitis of left finger: Secondary | ICD-10-CM | POA: Insufficient documentation

## 2015-01-29 DIAGNOSIS — M1990 Unspecified osteoarthritis, unspecified site: Secondary | ICD-10-CM | POA: Diagnosis not present

## 2015-01-29 DIAGNOSIS — T8130XA Disruption of wound, unspecified, initial encounter: Principal | ICD-10-CM | POA: Insufficient documentation

## 2015-01-29 DIAGNOSIS — L02512 Cutaneous abscess of left hand: Secondary | ICD-10-CM | POA: Insufficient documentation

## 2015-01-29 HISTORY — PX: I&D EXTREMITY: SHX5045

## 2015-01-29 LAB — CBC WITH DIFFERENTIAL/PLATELET
Basophils Absolute: 0 10*3/uL (ref 0.0–0.1)
Basophils Relative: 0 %
Eosinophils Absolute: 0.1 10*3/uL (ref 0.0–0.7)
Eosinophils Relative: 0 %
HCT: 37.3 % — ABNORMAL LOW (ref 39.0–52.0)
Hemoglobin: 12.4 g/dL — ABNORMAL LOW (ref 13.0–17.0)
Lymphocytes Relative: 9 %
Lymphs Abs: 1.7 10*3/uL (ref 0.7–4.0)
MCH: 31 pg (ref 26.0–34.0)
MCHC: 33.2 g/dL (ref 30.0–36.0)
MCV: 93.3 fL (ref 78.0–100.0)
Monocytes Absolute: 1.1 10*3/uL — ABNORMAL HIGH (ref 0.1–1.0)
Monocytes Relative: 6 %
Neutro Abs: 15.8 10*3/uL — ABNORMAL HIGH (ref 1.7–7.7)
Neutrophils Relative %: 85 %
Platelets: 294 10*3/uL (ref 150–400)
RBC: 4 MIL/uL — ABNORMAL LOW (ref 4.22–5.81)
RDW: 13.7 % (ref 11.5–15.5)
WBC: 18.7 10*3/uL — ABNORMAL HIGH (ref 4.0–10.5)

## 2015-01-29 LAB — BASIC METABOLIC PANEL
Anion gap: 12 (ref 5–15)
BUN: 6 mg/dL (ref 6–20)
CO2: 25 mmol/L (ref 22–32)
Calcium: 9.2 mg/dL (ref 8.9–10.3)
Chloride: 101 mmol/L (ref 101–111)
Creatinine, Ser: 0.65 mg/dL (ref 0.61–1.24)
GFR calc Af Amer: >60 mL/min (ref >60)
GFR calc non Af Amer: >60 mL/min (ref >60)
Glucose, Bld: 112 mg/dL — ABNORMAL HIGH (ref 65–99)
Potassium: 4 mmol/L (ref 3.5–5.1)
Sodium: 138 mmol/L (ref 135–145)

## 2015-01-29 SURGERY — IRRIGATION AND DEBRIDEMENT EXTREMITY
Anesthesia: General | Site: Finger | Laterality: Left

## 2015-01-29 MED ORDER — HYDROMORPHONE HCL 1 MG/ML IJ SOLN
INTRAMUSCULAR | Status: AC
  Filled 2015-01-29: qty 1

## 2015-01-29 MED ORDER — DEXAMETHASONE SODIUM PHOSPHATE 4 MG/ML IJ SOLN
INTRAMUSCULAR | Status: AC
  Filled 2015-01-29: qty 1

## 2015-01-29 MED ORDER — SODIUM CHLORIDE 0.9 % IV SOLN
3.0000 g | Freq: Three times a day (TID) | INTRAVENOUS | Status: DC
  Filled 2015-01-29 (×3): qty 3

## 2015-01-29 MED ORDER — MORPHINE SULFATE (PF) 4 MG/ML IV SOLN
4.0000 mg | Freq: Once | INTRAVENOUS | Status: AC
  Administered 2015-01-29: 4 mg via INTRAVENOUS
  Filled 2015-01-29: qty 1

## 2015-01-29 MED ORDER — FENTANYL CITRATE (PF) 250 MCG/5ML IJ SOLN
INTRAMUSCULAR | Status: AC
  Filled 2015-01-29: qty 5

## 2015-01-29 MED ORDER — OXYCODONE HCL 5 MG/5ML PO SOLN: 5.0000 mg | Freq: Once | ORAL | Status: DC | PRN

## 2015-01-29 MED ORDER — ONDANSETRON HCL 4 MG/2ML IJ SOLN: 4.0000 mg | Freq: Four times a day (QID) | INTRAMUSCULAR | Status: DC | PRN

## 2015-01-29 MED ORDER — CLINDAMYCIN PHOSPHATE 600 MG/50ML IV SOLN
600.0000 mg | Freq: Once | INTRAVENOUS | Status: AC
  Administered 2015-01-29: 600 mg via INTRAVENOUS
  Filled 2015-01-29: qty 50

## 2015-01-29 MED ORDER — SODIUM CHLORIDE 0.9 % IR SOLN
Status: DC | PRN
  Administered 2015-01-29: 3000 mL

## 2015-01-29 MED ORDER — ONDANSETRON HCL 4 MG/2ML IJ SOLN
INTRAMUSCULAR | Status: DC | PRN
  Administered 2015-01-29: 4 mg via INTRAVENOUS

## 2015-01-29 MED ORDER — OXYCODONE HCL 5 MG PO TABS: 5.0000 mg | ORAL_TABLET | Freq: Once | ORAL | Status: DC | PRN

## 2015-01-29 MED ORDER — LACTATED RINGERS IV SOLN
INTRAVENOUS | Status: DC
  Administered 2015-01-29 (×2): via INTRAVENOUS

## 2015-01-29 MED ORDER — OXYCODONE HCL 5 MG PO TABS: 5.0000 mg | ORAL_TABLET | ORAL | Status: DC | PRN

## 2015-01-29 MED ORDER — PROPOFOL 10 MG/ML IV BOLUS
INTRAVENOUS | Status: AC
  Filled 2015-01-29: qty 20

## 2015-01-29 MED ORDER — LIDOCAINE HCL (CARDIAC) 20 MG/ML IV SOLN
INTRAVENOUS | Status: DC | PRN
  Administered 2015-01-29: 80 mg via INTRATRACHEAL

## 2015-01-29 MED ORDER — SODIUM CHLORIDE 0.9 % IV BOLUS (SEPSIS)
1000.0000 mL | Freq: Once | INTRAVENOUS | Status: AC
  Administered 2015-01-29: 1000 mL via INTRAVENOUS

## 2015-01-29 MED ORDER — BISACODYL 10 MG RE SUPP: 10.0000 mg | Freq: Every day | RECTAL | Status: DC | PRN

## 2015-01-29 MED ORDER — DEXAMETHASONE SODIUM PHOSPHATE 10 MG/ML IJ SOLN
INTRAMUSCULAR | Status: DC | PRN
  Administered 2015-01-29: 10 mg via INTRAVENOUS

## 2015-01-29 MED ORDER — PROPOFOL 10 MG/ML IV BOLUS
INTRAVENOUS | Status: DC | PRN
  Administered 2015-01-29: 200 mg via INTRAVENOUS

## 2015-01-29 MED ORDER — DIPHENHYDRAMINE HCL 25 MG PO CAPS: 25.0000 mg | ORAL_CAPSULE | Freq: Four times a day (QID) | ORAL | Status: DC | PRN

## 2015-01-29 MED ORDER — HYDROMORPHONE HCL 1 MG/ML IJ SOLN: 0.2500 mg | INTRAMUSCULAR | Status: DC | PRN

## 2015-01-29 MED ORDER — MIDAZOLAM HCL 2 MG/2ML IJ SOLN
INTRAMUSCULAR | Status: AC
  Filled 2015-01-29: qty 2

## 2015-01-29 MED ORDER — DOCUSATE SODIUM 100 MG PO CAPS: 100.0000 mg | ORAL_CAPSULE | Freq: Two times a day (BID) | ORAL | Status: DC

## 2015-01-29 MED ORDER — NICOTINE 21 MG/24HR TD PT24: 21.0000 mg | MEDICATED_PATCH | Freq: Every day | TRANSDERMAL | Status: DC

## 2015-01-29 MED ORDER — MAGNESIUM CITRATE PO SOLN: 1.0000 | Freq: Once | ORAL | Status: DC | PRN

## 2015-01-29 MED ORDER — PROMETHAZINE HCL 25 MG/ML IJ SOLN: 6.2500 mg | INTRAMUSCULAR | Status: DC | PRN

## 2015-01-29 MED ORDER — FENTANYL CITRATE (PF) 250 MCG/5ML IJ SOLN
INTRAMUSCULAR | Status: DC | PRN
  Administered 2015-01-29 (×2): 50 ug via INTRAVENOUS
  Administered 2015-01-29: 100 ug via INTRAVENOUS
  Administered 2015-01-29: 50 ug via INTRAVENOUS
  Administered 2015-01-29: 150 ug via INTRAVENOUS
  Administered 2015-01-29: 100 ug via INTRAVENOUS

## 2015-01-29 MED ORDER — ONDANSETRON HCL 4 MG/2ML IJ SOLN
INTRAMUSCULAR | Status: AC
  Filled 2015-01-29: qty 2

## 2015-01-29 MED ORDER — LIDOCAINE HCL (CARDIAC) 20 MG/ML IV SOLN
INTRAVENOUS | Status: AC
  Filled 2015-01-29: qty 5

## 2015-01-29 MED ORDER — MIDAZOLAM HCL 5 MG/5ML IJ SOLN
INTRAMUSCULAR | Status: DC | PRN
  Administered 2015-01-29: 2 mg via INTRAVENOUS

## 2015-01-29 MED ORDER — ONDANSETRON HCL 4 MG PO TABS: 4.0000 mg | ORAL_TABLET | Freq: Four times a day (QID) | ORAL | Status: DC | PRN

## 2015-01-29 MED ORDER — SENNA 8.6 MG PO TABS: 1.0000 | ORAL_TABLET | Freq: Two times a day (BID) | ORAL | Status: DC

## 2015-01-29 MED ORDER — SODIUM CHLORIDE 0.9 % IV SOLN: INTRAVENOUS | Status: DC

## 2015-01-29 MED ORDER — HYDROMORPHONE HCL 1 MG/ML IJ SOLN: 0.5000 mg | INTRAMUSCULAR | Status: DC | PRN

## 2015-01-29 MED ORDER — HYDROMORPHONE HCL 1 MG/ML IJ SOLN
0.2500 mg | INTRAMUSCULAR | Status: DC | PRN
  Administered 2015-01-29: 0.5 mg via INTRAVENOUS

## 2015-01-29 MED ORDER — VANCOMYCIN HCL IN DEXTROSE 1-5 GM/200ML-% IV SOLN
INTRAVENOUS | Status: AC
  Administered 2015-01-29: 1000 mg via INTRAVENOUS
  Filled 2015-01-29: qty 200

## 2015-01-29 MED ORDER — VANCOMYCIN HCL IN DEXTROSE 1-5 GM/200ML-% IV SOLN
1000.0000 mg | Freq: Three times a day (TID) | INTRAVENOUS | Status: DC
  Filled 2015-01-29 (×2): qty 200

## 2015-01-29 SURGICAL SUPPLY — 59 items
BANDAGE ACE 4X5 VEL STRL LF (GAUZE/BANDAGES/DRESSINGS) ×3 IMPLANT
BANDAGE ELASTIC 4 VELCRO ST LF (GAUZE/BANDAGES/DRESSINGS) ×3 IMPLANT
BANDAGE ESMARK 6X9 LF (GAUZE/BANDAGES/DRESSINGS) IMPLANT
BLADE SURG 10 STRL SS (BLADE) ×3 IMPLANT
BNDG COHESIVE 4X5 TAN STRL (GAUZE/BANDAGES/DRESSINGS) IMPLANT
BNDG COHESIVE 6X5 TAN STRL LF (GAUZE/BANDAGES/DRESSINGS) IMPLANT
BNDG CONFORM 3 STRL LF (GAUZE/BANDAGES/DRESSINGS) IMPLANT
BNDG ESMARK 6X9 LF (GAUZE/BANDAGES/DRESSINGS)
BNDG GAUZE ELAST 4 BULKY (GAUZE/BANDAGES/DRESSINGS) ×3 IMPLANT
CANISTER SUCT 3000ML PPV (MISCELLANEOUS) ×3 IMPLANT
CHLORAPREP W/TINT 26ML (MISCELLANEOUS) ×3 IMPLANT
CONT SPEC 4OZ CLIKSEAL STRL BL (MISCELLANEOUS) ×3 IMPLANT
COVER SURGICAL LIGHT HANDLE (MISCELLANEOUS) IMPLANT
CUFF TOURNIQUET SINGLE 34IN LL (TOURNIQUET CUFF) IMPLANT
CUFF TOURNIQUET SINGLE 44IN (TOURNIQUET CUFF) IMPLANT
DRAIN CHANNEL 10M FLAT 3/4 FLT (DRAIN) IMPLANT
DRAIN PENROSE 1/2X12 LTX STRL (WOUND CARE) IMPLANT
DRAPE U-SHAPE 47X51 STRL (DRAPES) ×3 IMPLANT
DRSG ADAPTIC 3X8 NADH LF (GAUZE/BANDAGES/DRESSINGS) IMPLANT
DRSG PAD ABDOMINAL 8X10 ST (GAUZE/BANDAGES/DRESSINGS) IMPLANT
ELECT REM PT RETURN 9FT ADLT (ELECTROSURGICAL) ×3
ELECTRODE REM PT RTRN 9FT ADLT (ELECTROSURGICAL) ×1 IMPLANT
EVACUATOR SILICONE 100CC (DRAIN) IMPLANT
GAUZE SPONGE 4X4 12PLY STRL (GAUZE/BANDAGES/DRESSINGS) ×3 IMPLANT
GLOVE BIO SURGEON STRL SZ8 (GLOVE) ×3 IMPLANT
GLOVE BIOGEL PI IND STRL 8 (GLOVE) ×1 IMPLANT
GLOVE BIOGEL PI INDICATOR 8 (GLOVE) ×2
GLOVE ECLIPSE 7.5 STRL STRAW (GLOVE) IMPLANT
GLOVE SS BIOGEL STRL SZ 7.5 (GLOVE) ×1 IMPLANT
GLOVE SUPERSENSE BIOGEL SZ 7.5 (GLOVE) ×2
GOWN STRL REUS W/ TWL XL LVL3 (GOWN DISPOSABLE) ×1 IMPLANT
GOWN STRL REUS W/TWL XL LVL3 (GOWN DISPOSABLE) ×2
KIT BASIN OR (CUSTOM PROCEDURE TRAY) ×3 IMPLANT
KIT ROOM TURNOVER OR (KITS) ×3 IMPLANT
LOOP VESSEL MINI RED (MISCELLANEOUS) ×3 IMPLANT
NS IRRIG 1000ML POUR BTL (IV SOLUTION) ×3 IMPLANT
PACK ORTHO EXTREMITY (CUSTOM PROCEDURE TRAY) ×3 IMPLANT
PAD ARMBOARD 7.5X6 YLW CONV (MISCELLANEOUS) ×6 IMPLANT
PAD CAST 4YDX4 CTTN HI CHSV (CAST SUPPLIES) ×1 IMPLANT
PADDING CAST COTTON 4X4 STRL (CAST SUPPLIES) ×2
SET CYSTO W/LG BORE CLAMP LF (SET/KITS/TRAYS/PACK) ×3 IMPLANT
SOAP 2 % CHG 4 OZ (WOUND CARE) IMPLANT
SPONGE GAUZE 4X4 12PLY STER LF (GAUZE/BANDAGES/DRESSINGS) ×3 IMPLANT
SPONGE LAP 4X18 X RAY DECT (DISPOSABLE) ×3 IMPLANT
STAPLER VISISTAT 35W (STAPLE) IMPLANT
SUCTION FRAZIER TIP 10 FR DISP (SUCTIONS) IMPLANT
SUT ETHILON 3 0 FSL (SUTURE) IMPLANT
SUT PROLENE 3 0 PS 2 (SUTURE) IMPLANT
SUT VIC AB 2-0 CT1 27 (SUTURE) ×2
SUT VIC AB 2-0 CT1 TAPERPNT 27 (SUTURE) ×1 IMPLANT
SUT VIC AB 3-0 PS2 18 (SUTURE) ×2
SUT VIC AB 3-0 PS2 18XBRD (SUTURE) ×1 IMPLANT
TOWEL OR 17X24 6PK STRL BLUE (TOWEL DISPOSABLE) ×3 IMPLANT
TOWEL OR 17X26 10 PK STRL BLUE (TOWEL DISPOSABLE) ×3 IMPLANT
TUBE CONNECTING 12'X1/4 (SUCTIONS) ×1
TUBE CONNECTING 12X1/4 (SUCTIONS) ×2 IMPLANT
TUBING CYSTO DISP (UROLOGICAL SUPPLIES) ×3 IMPLANT
WATER STERILE IRR 1000ML POUR (IV SOLUTION) ×3 IMPLANT
YANKAUER SUCT BULB TIP NO VENT (SUCTIONS) ×3 IMPLANT

## 2015-01-29 NOTE — Progress Notes (Signed)
Patient awake . " I want to go home. You can't make me stay.

## 2015-01-29 NOTE — ED Notes (Signed)
Ortho MD at bedside.

## 2015-01-29 NOTE — ED Provider Notes (Signed)
CSN: [id number]     Arrival date & time 01/29/15  [id number] History   First MD Initiated Contact with Patient 01/29/15 [phone]     Chief Complaint  Patient presents with  . Post-op Problem  . Hand Problem     (Consider location/radiation/quality/duration/timing/severity/associated sxs/prior Treatment) HPI   39yM with L index finger pain/swelling. S/p excision of inclusion cyst 1/11. Reports increasing pain, swelling & redness. Extending proximally up into his hand. Not having purulent, foul-smelling drainage. He is discharged with clindamycin. Reports she has been taking it. Subjective fever. Denies any past history of diabetes, steroid use, HIV or other immunocompromising state.   Past Medical History  Diagnosis Date  . Arthritis    Past Surgical History  Procedure Laterality Date  . Knee surgery Left   . Wrist surgery    . Finger surgery     No family history on file. Social History  Substance Use Topics  . Smoking status: Current Every Day Smoker  . Smokeless tobacco: None  . Alcohol Use: Yes    Review of Systems  All systems reviewed and negative, other than as noted in HPI.   Allergies  Review of patient's allergies indicates no known allergies.  Home Medications   Prior to Admission medications   Medication Sig Start Date End Date Taking? Authorizing Provider  clindamycin (CLEOCIN) 300 MG capsule Take 1 capsule (300 mg total) by mouth 3 (three) times daily. 01/26/15  Yes Jeffrey Hedges, PA-C  meloxicam (MOBIC) 15 MG tablet Take 15 mg by mouth daily. 01/20/15  Yes Historical Provider, MD  oxyCODONE-acetaminophen (ROXICET) 5-325 MG tablet Take 1 tablet by mouth every 4 (four) hours as needed for severe pain. 01/26/15  Yes Bradly BienenstockFred Ortmann, MD  traMADol (ULTRAM) 50 MG tablet Take 1 tablet (50 mg total) by mouth every 12 (twelve) hours as needed. 10/06/14  Yes Timothy R Draper, DO   BP 130/80 mmHg  Pulse 86  Temp(Src) 99 F (37.2 C) (Oral)  Resp 16  Ht 5\' 5"  (1.651 m)  Wt 160  lb (72.576 kg)  BMI 26.63 kg/m2  SpO2 99% Physical Exam  Constitutional: He appears well-developed and well-nourished. No distress.  HENT:  Head: Normocephalic and atraumatic.  Eyes: Conjunctivae are normal. Right eye exhibits no discharge. Left eye exhibits no discharge.  Neck: Neck supple.  Cardiovascular: Normal rate, regular rhythm and normal heart sounds.  Exam reveals no gallop and no friction rub.   No murmur heard. Pulmonary/Chest: Effort normal and breath sounds normal. No respiratory distress.  Abdominal: Soft. He exhibits no distension. There is no tenderness.  Musculoskeletal: He exhibits no edema or tenderness.  Sutured wound to the ulnar aspect of the mid left index finger. Wound is dehisced. Foul-smelling, purulent drainage. There is diffuse swelling of the entire digit extending up into the distal hand. Range of motion at the interphalangeal and MP joints of the fourth finger are significantly limited secondary to pain. Sensation is intact to finger tip. Good cap refill.  Neurological: He is alert.  Skin: Skin is warm and dry.  Psychiatric: He has a normal mood and affect. His behavior is normal. Thought content normal.  Nursing note and vitals reviewed.   ED Course  Procedures (including critical care time) Labs Review Labs Reviewed  CBC WITH DIFFERENTIAL/PLATELET - Abnormal; Notable for the following:    WBC 18.7 (*)    RBC 4.00 (*)    Hemoglobin 12.4 (*)    HCT 37.3 (*)    Neutro Abs 15.8 (*)  Monocytes Absolute 1.1 (*)    All other components within normal limits  BASIC METABOLIC PANEL - Abnormal; Notable for the following:    Glucose, Bld 112 (*)    All other components within normal limits  CULTURE, ROUTINE-ABSCESS  AFB CULTURE WITH SMEAR  ANAEROBIC CULTURE  TISSUE CULTURE    Imaging Review No results found. I have personally reviewed and evaluated these images and lab results as part of my medical decision-making.   EKG Interpretation None       MDM   Final diagnoses:  Abscess of left hand including fingers    39yM with worsening pain/swellign L index finger. S/p removal inclusion cyst 3d ago. Clinically infected. Suture removed. IV abx. Will discuss with ortho.     Raeford Razor, MD 02/09/15 1322

## 2015-01-29 NOTE — Transfer of Care (Signed)
Immediate Anesthesia Transfer of Care Note  Patient: Timothy Li  Procedure(s) Performed: Procedure(s): IRRIGATION AND DEBRIDEMENT  INDEX FINGER (Left)  Patient Location: PACU  Anesthesia Type:General  Level of Consciousness: awake  Airway & Oxygen Therapy: Patient Spontanous Breathing  Post-op Assessment: Report given to RN and Post -op Vital signs reviewed and stable  Post vital signs: Reviewed and stable  Last Vitals:  Filed Vitals:   01/29/15 1100 01/29/15 1145  BP: 130/92 124/72  Pulse: 91 67  Temp:    Resp: 16 16    Complications: No apparent anesthesia complications

## 2015-01-29 NOTE — Anesthesia Preprocedure Evaluation (Addendum)
Anesthesia Evaluation  Patient identified by MRN, date of birth, ID band Patient awake    Reviewed: Allergy & Precautions, NPO status , Patient's Chart, lab work & pertinent test results  History of Anesthesia Complications Negative for: history of anesthetic complications  Airway Mallampati: II  TM Distance: >3 FB Neck ROM: Full    Dental  (+) Teeth Intact, Dental Advisory Given   Pulmonary Current Smoker,    Pulmonary exam normal        Cardiovascular negative cardio ROS Normal cardiovascular exam     Neuro/Psych negative neurological ROS  negative psych ROS   GI/Hepatic negative GI ROS, Neg liver ROS,   Endo/Other  negative endocrine ROS  Renal/GU      Musculoskeletal   Abdominal   Peds  Hematology   Anesthesia Other Findings   Reproductive/Obstetrics                           Anesthesia Physical Anesthesia Plan  ASA: II  Anesthesia Plan: General   Post-op Pain Management:    Induction: Intravenous  Airway Management Planned: LMA  Additional Equipment:   Intra-op Plan:   Post-operative Plan: Extubation in OR  Informed Consent: I have reviewed the patients History and Physical, chart, labs and discussed the procedure including the risks, benefits and alternatives for the proposed anesthesia with the patient or authorized representative who has indicated his/her understanding and acceptance.   Dental advisory given  Plan Discussed with: CRNA, Anesthesiologist and Surgeon  Anesthesia Plan Comments:        Anesthesia Quick Evaluation

## 2015-01-29 NOTE — Addendum Note (Signed)
Addendum  created 01/29/15 1451 by Heather RobertsJames Euriah Matlack, MD   Modules edited: Orders, PRL Based Order Sets

## 2015-01-29 NOTE — Progress Notes (Signed)
Wasted Dilaudid 0.5 mg IV in trash. Witnessed by Bear Stearnseresa Cittty Rn. Pt discharged and unable to use pyxis

## 2015-01-29 NOTE — Brief Op Note (Signed)
01/29/2015  1:47 PM  PATIENT:  Timothy Li  40 y.o. male  PRE-OPERATIVE DIAGNOSIS:  left index finger wound dehiscence and cellulitis  POST-OPERATIVE DIAGNOSIS: 1.  Left dorsal hand abscess      2.  Left index finger wound dehiscence      3.  Left index finger deep necrosis  Procedure(s): 1.  Irrigation and excisional debridement of left index finger wound including skin, subcutaneous tissue and bone 2.  Irrigation and excisional debridement of left dorsal hand abscess through separate dorsal and palmar incisions  SURGEON:  Toni ArthursJohn Shamonique Battiste, MD  ASSISTANT: n/a  ANESTHESIA:   General  EBL:  minimal   TOURNIQUET:   Total Tourniquet Time Documented: Forearm (laterality) - 19 minutes Total: Forearm (laterality) - 19 minutes  COMPLICATIONS:  None apparent  DISPOSITION:  Extubated, awake and stable to recovery.  DICTATION ID:  [id number]

## 2015-01-29 NOTE — ED Notes (Signed)
Pt reports had surgery to left finger on Monday and noticed swelling since. Pt reports odor since surgery. Pt presents with redness and swelling and coban wrap to finger and wrist area.

## 2015-01-29 NOTE — Progress Notes (Signed)
Dr. Victorino DikeHewitt at bedside. Spoke with pt concerning need to stay . Discussed what he did in surgery and need for further treatment in hospital . Pt stated he would stay. No family or visitors found. Pt doesn't know any telephone #'s and someone (?girlfriend} took all belongings prior to surgery.

## 2015-01-29 NOTE — H&P (Signed)
Timothy Li is an 40 y.o. male.   Chief Complaint:  Left index finger swelling and drainage HPI: 40 y/o LHD male c/o pain and swelling in the left hand for 5 days.  He underwent excision of an inclusion cyst by Dr. Caralyn Guile in the ER Monday.  He started oral clindamycin 24 hours later.  He notes increased swelling and drainage of the last couple of days.  He c/o fever, chills and feeling poorly.  He last ate yesterday.  He smokes 1/2 ppd cigarettes per day.  No h/o IV drug use or HIV.  No h/o diabetes.  Past Medical History  Diagnosis Date  . Arthritis     Past Surgical History  Procedure Laterality Date  . Knee surgery Left   . Wrist surgery    . Finger surgery      FH;  Noncontrib.  Social History:  reports that he has been smoking.  He does not have any smokeless tobacco history on file. He reports that he drinks alcohol. He reports that he uses illicit drugs (Marijuana).  Allergies: No Known Allergies  Medications Prior to Admission  Medication Sig Dispense Refill  . clindamycin (CLEOCIN) 300 MG capsule Take 1 capsule (300 mg total) by mouth 3 (three) times daily. 30 capsule 0  . meloxicam (MOBIC) 15 MG tablet Take 15 mg by mouth daily.  3  . oxyCODONE-acetaminophen (ROXICET) 5-325 MG tablet Take 1 tablet by mouth every 4 (four) hours as needed for severe pain. 30 tablet 0  . traMADol (ULTRAM) 50 MG tablet Take 1 tablet (50 mg total) by mouth every 12 (twelve) hours as needed. 60 tablet 3    Results for orders placed or performed during the hospital encounter of 01/29/15 (from the past 48 hour(s))  CBC with Differential     Status: Abnormal   Collection Time: 01/29/15  9:45 AM  Result Value Ref Range   WBC 18.7 (H) 4.0 - 10.5 K/uL   RBC 4.00 (L) 4.22 - 5.81 MIL/uL   Hemoglobin 12.4 (L) 13.0 - 17.0 g/dL   HCT 37.3 (L) 39.0 - 52.0 %   MCV 93.3 78.0 - 100.0 fL   MCH 31.0 26.0 - 34.0 pg   MCHC 33.2 30.0 - 36.0 g/dL   RDW 13.7 11.5 - 15.5 %   Platelets 294 150 - 400 K/uL    Neutrophils Relative % 85 %   Neutro Abs 15.8 (H) 1.7 - 7.7 K/uL   Lymphocytes Relative 9 %   Lymphs Abs 1.7 0.7 - 4.0 K/uL   Monocytes Relative 6 %   Monocytes Absolute 1.1 (H) 0.1 - 1.0 K/uL   Eosinophils Relative 0 %   Eosinophils Absolute 0.1 0.0 - 0.7 K/uL   Basophils Relative 0 %   Basophils Absolute 0.0 0.0 - 0.1 K/uL  Basic metabolic panel     Status: Abnormal   Collection Time: 01/29/15  9:45 AM  Result Value Ref Range   Sodium 138 135 - 145 mmol/L   Potassium 4.0 3.5 - 5.1 mmol/L   Chloride 101 101 - 111 mmol/L   CO2 25 22 - 32 mmol/L   Glucose, Bld 112 (H) 65 - 99 mg/dL   BUN 6 6 - 20 mg/dL   Creatinine, Ser 0.65 0.61 - 1.24 mg/dL   Calcium 9.2 8.9 - 10.3 mg/dL   GFR calc non Af Amer >60 >60 mL/min   GFR calc Af Amer >60 >60 mL/min    Comment: (NOTE) The eGFR has been  calculated using the CKD EPI equation. This calculation has not been validated in all clinical situations. eGFR's persistently <60 mL/min signify possible Chronic Kidney Disease.    Anion gap 12 5 - 15   No results found.  ROS  No recent wt loss.  O/w as above   Blood pressure 124/72, pulse 67, temperature 99 F (37.2 C), temperature source Oral, resp. rate 16, height '5\' 5"'  (1.651 m), weight 72.576 kg (160 lb), SpO2 96 %. Physical Exam  wn wd male in nad.  A and O x 4.  Mood and affect normal.  EOMi.  resp unlabored.  L hand with swelling dorsally extending from the index finger to the wrist.  +erythema, warmth and tenderness.  No significant tenderness along the flexor tendon sheath.  Midlateral incision on the ulnar side of the index finger with dehiscence.  Grossly purulent drainage from the dorsal part of the incision.  Brisk cap refill at the index finger.  Sens to LT intact radially and ulnarly on the digit.  No lymphadenopathy.  Assessment/Plan L index finger wound dehiscence - to OR for repeat I and D.  We'll plan admission and 24 hours of IV abx post op.  The risks and benefits of the  alternative treatment options have been discussed in detail.  The patient wishes to proceed with surgery and specifically understands risks of bleeding, infection, nerve damage, blood clots, need for additional surgery, amputation and death.   Wylene Simmer 02/11/2015, 12:23 PM

## 2015-01-29 NOTE — Anesthesia Postprocedure Evaluation (Signed)
Anesthesia Post Note  Patient: Timothy Li  Procedure(s) Performed: Procedure(s) (LRB): IRRIGATION AND DEBRIDEMENT  INDEX FINGER (Left)  Patient location during evaluation: PACU Anesthesia Type: General Level of consciousness: sedated Pain management: pain level controlled Vital Signs Assessment: post-procedure vital signs reviewed and stable Respiratory status: spontaneous breathing and respiratory function stable Cardiovascular status: stable Anesthetic complications: no    Last Vitals:  Filed Vitals:   01/29/15 1410 01/29/15 1415  BP: 141/88 133/84  Pulse: 103 97  Temp:    Resp: 13 14    Last Pain:  Filed Vitals:   01/29/15 1415  PainSc: 4                  Aariana Shankland DANIEL

## 2015-01-29 NOTE — Progress Notes (Signed)
ANTIBIOTIC CONSULT NOTE - INITIAL  Pharmacy Consult for Vancomycin and Unasyn Indication: wound infection  No Known Allergies  Patient Measurements: Height: 5\' 5"  (165.1 cm) Weight: 160 lb (72.576 kg) IBW/kg (Calculated) : 61.5 Adjusted Body Weight:   Vital Signs: Temp: 99.2 F (37.3 C) (01/14 1500) Temp Source: Oral (01/14 0647) BP: 125/75 mmHg (01/14 1515) Pulse Rate: 79 (01/14 1515) Intake/Output from previous day:   Intake/Output from this shift: Total I/O In: 1650 [I.V.:1650] Out: 352 [Urine:350; Blood:2]  Labs:  Recent Labs  01/29/15 0945  WBC 18.7*  HGB 12.4*  PLT 294  CREATININE 0.65   Estimated Creatinine Clearance: 108.9 mL/min (by C-G formula based on Cr of 0.65). No results for input(s): VANCOTROUGH, VANCOPEAK, VANCORANDOM, GENTTROUGH, GENTPEAK, GENTRANDOM, TOBRATROUGH, TOBRAPEAK, TOBRARND, AMIKACINPEAK, AMIKACINTROU, AMIKACIN in the last 72 hours.   Microbiology: Recent Results (from the past 720 hour(s))  Wound culture     Status: None (Preliminary result)   Collection Time: 01/26/15  5:17 PM  Result Value Ref Range Status   Specimen Description WOUND LEFT FINGER  Final   Special Requests NONE  Final   Gram Stain   Final    FEW WBC PRESENT,BOTH PMN AND MONONUCLEAR NO SQUAMOUS EPITHELIAL CELLS SEEN NO ORGANISMS SEEN Performed at [facility]    Culture   Final    Culture reincubated for better growth Performed at [facility]    Report Status PENDING  Incomplete    Medical History: Past Medical History  Diagnosis Date  . Arthritis     Medications:  Prescriptions prior to admission  Medication Sig Dispense Refill Last Dose  . clindamycin (CLEOCIN) 300 MG capsule Take 1 capsule (300 mg total) by mouth 3 (three) times daily. 30 capsule 0 01/28/2015 at Unknown time  . meloxicam (MOBIC) 15 MG tablet Take 15 mg by mouth daily.  3 Past Week at Unknown time  . oxyCODONE-acetaminophen (ROXICET) 5-325 MG tablet Take 1 tablet by  mouth every 4 (four) hours as needed for severe pain. 30 tablet 0 01/28/2015 at Unknown time  . traMADol (ULTRAM) 50 MG tablet Take 1 tablet (50 mg total) by mouth every 12 (twelve) hours as needed. 60 tablet 3 Past Week at Unknown time   Scheduled:  . docusate sodium  100 mg Oral BID  . HYDROmorphone      . nicotine  21 mg Transdermal Daily  . senna  1 tablet Oral BID   Infusions:  . sodium chloride     Assessment: [age]yo male presents with moderate to severe hand infection with purulence. Pharmacy is consulted to dose vancomycin and unasyn for wound infection. Tmax 99.2, WBC 18.7, sCr 0.65.  Goal of Therapy:  Vancomycin trough level 15-20 mcg/ml  Plan:  Vancomycin 1g IV q8h Unasyn 3g IV q8h Measure antibiotic drug levels at steady state Follow up culture results, renal function and clinical course  Arlean HoppingCorey M. Newman PiesBall, PharmD, BCPS Clinical Pharmacist Pager [phone] 01/29/2015,3:46 PM

## 2015-01-29 NOTE — Progress Notes (Signed)
Patient agitated and wanted to go home. Explained the risks involved in going home without medical advice and the patient is fully aware. Patient states that he has antibiotics and pain medication at home and will follow up with the doctor on Monday with an appointment that was previously scheduled before surgery. Paged Dr Victorino DikeHewitt at [phone] and spoke with Alfredo MartinezJustin Ollis PA about the patients decision to leave AMA. Paperwork was signed and placed in his chart. Patient does not complain of any pain or discomfort at this time.

## 2015-01-29 NOTE — ED Notes (Signed)
MD at bedside. 

## 2015-01-29 NOTE — Anesthesia Procedure Notes (Signed)
Procedure Name: LMA Insertion Date/Time: 01/29/2015 12:54 PM Performed by: Alanda AmassFRIEDMAN, Lakeem Rozo A Pre-anesthesia Checklist: Patient identified, Timeout performed, Emergency Drugs available, Suction available and Patient being monitored Patient Re-evaluated:Patient Re-evaluated prior to inductionOxygen Delivery Method: Circle system utilized Preoxygenation: Pre-oxygenation with 100% oxygen Intubation Type: IV induction LMA: LMA inserted LMA Size: 4.0 Number of attempts: 1 Placement Confirmation: positive ETCO2 and breath sounds checked- equal and bilateral Tube secured with: Tape Dental Injury: Teeth and Oropharynx as per pre-operative assessment

## 2015-01-30 LAB — WOUND CULTURE

## 2015-01-30 NOTE — Op Note (Signed)
[name], Li              ACCOUNT NO.:  [id number]  MEDICAL RECORD NO.:  [id number]  LOCATION:  [id number]                        FACILITY:  [facility]  PHYSICIAN:  Timothy ArthursJohn Nayelly Laughman, MD        DATE OF BIRTH:  [birth year]  DATE OF PROCEDURE:  01/29/2015 DATE OF DISCHARGE:  01/29/2015                              OPERATIVE REPORT   PREOPERATIVE DIAGNOSIS:  Left index finger wound dehiscence and cellulitis.  POSTOPERATIVE DIAGNOSES: 1. Left dorsal hand abscess. 2. Left index finger wound dehiscence and cellulitis. 3. Left index finger deep and superficial tissue necrosis.  PROCEDURE: 1. Irrigation and excisional debridement of left index finger wound     including skin, subcutaneous tissue, and bone. 2. Irrigation and excisional debridement of left dorsal hand abscess     through separate dorsal and palmar incisions.  SURGEON:  Timothy ArthursJohn Haroun Cotham, MD.  ANESTHESIA:  General.  ESTIMATED BLOOD LOSS:  Minimal.  TOURNIQUET TIME:  19 minutes with a wrist Esmarch.  COMPLICATIONS:  None apparent.  DISPOSITION:  Extubated, awake, and stable to recovery.  SPECIMENS:  Deep tissue from the abscess to Microbiology for aerobic and anaerobic culture.  INDICATIONS FOR PROCEDURE:  The patient is a 40 year old left-hand dominant male with past medical history significant for smoking. Approximately, 10 days ago, he started having swelling and pain in the left index finger.  He was seen 5 days ago in the emergency department by Timothy Li.  He underwent excision of what appeared to be an infected inclusion cyst from the ulnar border of the left index finger. He went home with oral antibiotics but was unable to start taking them for 24 hours.  He started taking them and has been on oral clindamycin 3 times a day since that time.  He noticed increasing pain and drainage from the wound yesterday.  He presented to the emergency department this morning.  He complained of feeling feverish as well as  nauseated since yesterday.  He last ate yesterday.  He presents now for repeat I and D of the finger wound.  He understands the risks and benefits, the alternative treatment options, and elects surgical treatment.  He specifically understands risks of bleeding, infection, nerve damage, blood clots, need for additional surgery, continued pain, recurrence of his infection, amputation, and death.  PROCEDURE IN DETAIL:  After preoperative consent was obtained and the correct operative site was identified, the patient was brought to the operating room and placed supine on the operating table.  General anesthesia was induced.  Preoperative antibiotics were administered. Surgical time-out was taken.  The left upper extremity was prepped and draped in standard sterile fashion.  The hand was carefully exsanguinated and a 4-inch Esmarch tourniquet was wrapped around the muscular portion of the forearm as a tourniquet.  The wound was then carefully examined.  There was superficial skin slough both dorsal and palmar to the mid lateral incision on the ulnar aspect of the index finger.  This was all debrided sharply with scissors.  There was an area dorsal to the incision where the skin was necrotic over an area of approximately 2 cm2.  This was sharply excised.  The area of necrosis extended down  to the level of the extensor tendon and the periosteum of the bone.  This was also sharply excised.  Blunt dissection was then carried along the course of the extensor tendon tracking along where the purulent drainage came from.  This extended into a large dorsal abscess over the index metacarpal and into the dorsum of the hand.  A separate longitudinal incision was made over the index finger metacarpophalangeal joint.  There was immediate evidence of abundant purulence.  A specimen of this fluid was sent to Microbiology for aerobic and anaerobic culture.  The necrotic and nonviable tissue was debrided  sharply with a scalpel.  Blunt dissection was carried down into the second webspace.  A small palmar incision was made.  The abscess extended down into the area between the metatarsal heads in the form of an early collar button abscess.  At this point, 3 L of normal saline were used to irrigate the wound copiously.  The tourniquet was released. Hemostasis was achieved.  The remaining tissues all appeared generally viable.  A vessel loop was then passed from the dorsal hand incision down through the palmar hand incision, it was then passed along the dorsal aspect of the proximal phalanx and out through the ulnar wound. It was tied to itself as a drain.  Moistened gauze and sponges were packed into the two open wounds.  Dry gauze was then placed around the index finger and in between the fingers.  Kerlix was applied followed by a volar splint and Ace wrap.  The index finger was well perfused at the end of the case.  The patient was then awakened from anesthesia and transported to the recovery room in stable condition.  FOLLOWUP PLAN:  The patient will be admitted and started on IV vancomycin and Zosyn.  He will likely need a return to the operating room in the near future for repeat I and D.     Timothy Arthurs, MD     JH/MEDQ  D:  01/29/2015  T:  01/30/2015  Job:  [id number]  cc:   Timothy Li, M.D.

## 2015-01-31 ENCOUNTER — Encounter (HOSPITAL_COMMUNITY): Payer: Self-pay | Admitting: Orthopedic Surgery

## 2015-02-01 ENCOUNTER — Encounter (HOSPITAL_COMMUNITY): Payer: Self-pay | Admitting: *Deleted

## 2015-02-02 ENCOUNTER — Ambulatory Visit (HOSPITAL_COMMUNITY): Payer: 59 | Admitting: Certified Registered Nurse Anesthetist

## 2015-02-02 ENCOUNTER — Encounter (HOSPITAL_COMMUNITY): Payer: Self-pay | Admitting: Certified Registered Nurse Anesthetist

## 2015-02-02 ENCOUNTER — Ambulatory Visit (HOSPITAL_COMMUNITY)
Admission: RE | Admit: 2015-02-02 | Discharge: 2015-02-02 | Disposition: A | Discharge status: Home / Self Care | Payer: 59 | Source: Ambulatory Visit | Attending: Orthopedic Surgery | Admitting: Orthopedic Surgery

## 2015-02-02 ENCOUNTER — Encounter (HOSPITAL_COMMUNITY)
Admission: RE | Disposition: A | Discharge status: Home / Self Care | Payer: Self-pay | Source: Ambulatory Visit | Attending: Orthopedic Surgery

## 2015-02-02 DIAGNOSIS — M1990 Unspecified osteoarthritis, unspecified site: Secondary | ICD-10-CM | POA: Diagnosis not present

## 2015-02-02 DIAGNOSIS — Z791 Long term (current) use of non-steroidal anti-inflammatories (NSAID): Secondary | ICD-10-CM | POA: Diagnosis not present

## 2015-02-02 DIAGNOSIS — F17200 Nicotine dependence, unspecified, uncomplicated: Secondary | ICD-10-CM | POA: Diagnosis not present

## 2015-02-02 DIAGNOSIS — L02512 Cutaneous abscess of left hand: Secondary | ICD-10-CM | POA: Insufficient documentation

## 2015-02-02 HISTORY — PX: I&D EXTREMITY: SHX5045

## 2015-02-02 SURGERY — IRRIGATION AND DEBRIDEMENT EXTREMITY
Anesthesia: General | Site: Hand | Laterality: Left

## 2015-02-02 MED ORDER — VANCOMYCIN HCL IN DEXTROSE 1-5 GM/200ML-% IV SOLN
INTRAVENOUS | Status: AC
  Filled 2015-02-02: qty 200

## 2015-02-02 MED ORDER — HYDROMORPHONE HCL 1 MG/ML IJ SOLN
INTRAMUSCULAR | Status: AC
  Filled 2015-02-02: qty 1

## 2015-02-02 MED ORDER — PROMETHAZINE HCL 25 MG/ML IJ SOLN: 6.2500 mg | INTRAMUSCULAR | Status: DC | PRN

## 2015-02-02 MED ORDER — CLINDAMYCIN HCL 300 MG PO CAPS: 300.0000 mg | ORAL_CAPSULE | Freq: Three times a day (TID) | ORAL | Status: DC

## 2015-02-02 MED ORDER — BUPIVACAINE HCL (PF) 0.25 % IJ SOLN
INTRAMUSCULAR | Status: AC
  Filled 2015-02-02: qty 30

## 2015-02-02 MED ORDER — HYDROMORPHONE HCL 1 MG/ML IJ SOLN
0.2500 mg | INTRAMUSCULAR | Status: DC | PRN
  Administered 2015-02-02 (×4): 0.5 mg via INTRAVENOUS

## 2015-02-02 MED ORDER — FENTANYL CITRATE (PF) 100 MCG/2ML IJ SOLN
INTRAMUSCULAR | Status: DC | PRN
  Administered 2015-02-02 (×5): 50 ug via INTRAVENOUS

## 2015-02-02 MED ORDER — LACTATED RINGERS IV SOLN
INTRAVENOUS | Status: DC
  Administered 2015-02-02: 16:00:00 via INTRAVENOUS

## 2015-02-02 MED ORDER — BUPIVACAINE HCL (PF) 0.25 % IJ SOLN: INTRAMUSCULAR | Status: DC | PRN

## 2015-02-02 MED ORDER — HYDROMORPHONE HCL 1 MG/ML IJ SOLN
INTRAMUSCULAR | Status: DC
Start: 2015-02-02 — End: 2015-02-02
  Filled 2015-02-02: qty 1

## 2015-02-02 MED ORDER — PROPOFOL 10 MG/ML IV BOLUS
INTRAVENOUS | Status: AC
  Filled 2015-02-02: qty 40

## 2015-02-02 MED ORDER — MEPERIDINE HCL 25 MG/ML IJ SOLN: 6.2500 mg | INTRAMUSCULAR | Status: DC | PRN

## 2015-02-02 MED ORDER — OXYCODONE-ACETAMINOPHEN 5-325 MG PO TABS
1.0000 | ORAL_TABLET | Freq: Once | ORAL | Status: AC
  Administered 2015-02-02: 1 via ORAL

## 2015-02-02 MED ORDER — VANCOMYCIN HCL IN DEXTROSE 1-5 GM/200ML-% IV SOLN
1000.0000 mg | INTRAVENOUS | Status: DC
  Administered 2015-02-02: 1000 mg via INTRAVENOUS

## 2015-02-02 MED ORDER — SODIUM CHLORIDE 0.9 % IR SOLN
Status: DC | PRN
  Administered 2015-02-02: 3000 mL
  Administered 2015-02-02: 1000 mL
  Administered 2015-02-02: 3000 mL

## 2015-02-02 MED ORDER — LACTATED RINGERS IV SOLN
INTRAVENOUS | Status: DC | PRN
  Administered 2015-02-02: 17:00:00 via INTRAVENOUS

## 2015-02-02 MED ORDER — MIDAZOLAM HCL 5 MG/5ML IJ SOLN
INTRAMUSCULAR | Status: DC | PRN
  Administered 2015-02-02: 2 mg via INTRAVENOUS

## 2015-02-02 MED ORDER — MIDAZOLAM HCL 2 MG/2ML IJ SOLN
INTRAMUSCULAR | Status: AC
  Filled 2015-02-02: qty 2

## 2015-02-02 MED ORDER — LIDOCAINE HCL (CARDIAC) 20 MG/ML IV SOLN
INTRAVENOUS | Status: DC | PRN
  Administered 2015-02-02: 50 mg via INTRAVENOUS

## 2015-02-02 MED ORDER — CHLORHEXIDINE GLUCONATE 4 % EX LIQD: 60.0000 mL | Freq: Once | CUTANEOUS | Status: DC

## 2015-02-02 MED ORDER — LACTATED RINGERS IV SOLN: INTRAVENOUS | Status: DC

## 2015-02-02 MED ORDER — OXYCODONE-ACETAMINOPHEN 5-325 MG PO TABS
ORAL_TABLET | ORAL | Status: DC
Start: 2015-02-02 — End: 2015-02-02
  Filled 2015-02-02: qty 1

## 2015-02-02 MED ORDER — OXYCODONE-ACETAMINOPHEN 5-325 MG PO TABS
1.0000 | ORAL_TABLET | ORAL | Status: DC | PRN
Start: 2015-02-02 — End: 2015-02-07

## 2015-02-02 MED ORDER — FENTANYL CITRATE (PF) 250 MCG/5ML IJ SOLN
INTRAMUSCULAR | Status: AC
  Filled 2015-02-02: qty 5

## 2015-02-02 SURGICAL SUPPLY — 53 items
BANDAGE ELASTIC 3 VELCRO ST LF (GAUZE/BANDAGES/DRESSINGS) ×2 IMPLANT
BANDAGE ELASTIC 4 VELCRO ST LF (GAUZE/BANDAGES/DRESSINGS) IMPLANT
BNDG COHESIVE 1X5 TAN STRL LF (GAUZE/BANDAGES/DRESSINGS) ×2 IMPLANT
BNDG CONFORM 2 STRL LF (GAUZE/BANDAGES/DRESSINGS) ×2 IMPLANT
BNDG ESMARK 4X9 LF (GAUZE/BANDAGES/DRESSINGS) ×2 IMPLANT
BNDG GAUZE ELAST 4 BULKY (GAUZE/BANDAGES/DRESSINGS) IMPLANT
CORDS BIPOLAR (ELECTRODE) ×2 IMPLANT
COVER SURGICAL LIGHT HANDLE (MISCELLANEOUS) ×2 IMPLANT
CUFF TOURNIQUET SINGLE 18IN (TOURNIQUET CUFF) ×2 IMPLANT
CUFF TOURNIQUET SINGLE 24IN (TOURNIQUET CUFF) IMPLANT
DRAIN PENROSE 1/4X12 LTX STRL (WOUND CARE) IMPLANT
DRAPE SURG 17X23 STRL (DRAPES) ×2 IMPLANT
DRSG ADAPTIC 3X8 NADH LF (GAUZE/BANDAGES/DRESSINGS) ×2 IMPLANT
ELECT REM PT RETURN 9FT ADLT (ELECTROSURGICAL)
ELECTRODE REM PT RTRN 9FT ADLT (ELECTROSURGICAL) IMPLANT
GAUZE SPONGE 4X4 12PLY STRL (GAUZE/BANDAGES/DRESSINGS) IMPLANT
GAUZE XEROFORM 1X8 LF (GAUZE/BANDAGES/DRESSINGS) IMPLANT
GAUZE XEROFORM 5X9 LF (GAUZE/BANDAGES/DRESSINGS) IMPLANT
GLOVE BIOGEL PI IND STRL 8.5 (GLOVE) ×1 IMPLANT
GLOVE BIOGEL PI INDICATOR 8.5 (GLOVE) ×1
GLOVE SURG ORTHO 8.0 STRL STRW (GLOVE) ×2 IMPLANT
GOWN STRL REUS W/ TWL LRG LVL3 (GOWN DISPOSABLE) ×1 IMPLANT
GOWN STRL REUS W/ TWL XL LVL3 (GOWN DISPOSABLE) ×1 IMPLANT
GOWN STRL REUS W/TWL LRG LVL3 (GOWN DISPOSABLE) ×1
GOWN STRL REUS W/TWL XL LVL3 (GOWN DISPOSABLE) ×1
HANDPIECE INTERPULSE COAX TIP (DISPOSABLE)
KIT BASIN OR (CUSTOM PROCEDURE TRAY) ×2 IMPLANT
KIT ROOM TURNOVER OR (KITS) ×2 IMPLANT
MANIFOLD NEPTUNE II (INSTRUMENTS) ×2 IMPLANT
NEEDLE HYPO 25GX1X1/2 BEV (NEEDLE) IMPLANT
NS IRRIG 1000ML POUR BTL (IV SOLUTION) ×2 IMPLANT
PACK ORTHO EXTREMITY (CUSTOM PROCEDURE TRAY) ×2 IMPLANT
PAD ARMBOARD 7.5X6 YLW CONV (MISCELLANEOUS) ×4 IMPLANT
PAD CAST 4YDX4 CTTN HI CHSV (CAST SUPPLIES) IMPLANT
PADDING CAST COTTON 4X4 STRL (CAST SUPPLIES)
SET HNDPC FAN SPRY TIP SCT (DISPOSABLE) IMPLANT
SOAP 2 % CHG 4 OZ (WOUND CARE) ×2 IMPLANT
SPONGE GAUZE 4X4 12PLY STER LF (GAUZE/BANDAGES/DRESSINGS) ×2 IMPLANT
SPONGE LAP 18X18 X RAY DECT (DISPOSABLE) ×2 IMPLANT
SPONGE LAP 4X18 X RAY DECT (DISPOSABLE) IMPLANT
SUCTION FRAZIER TIP 10 FR DISP (SUCTIONS) ×2 IMPLANT
SUT ETHILON 4 0 PS 2 18 (SUTURE) IMPLANT
SUT ETHILON 5 0 P 3 18 (SUTURE)
SUT NYLON ETHILON 5-0 P-3 1X18 (SUTURE) IMPLANT
SUT PROLENE 4 0 PS 2 18 (SUTURE) ×2 IMPLANT
SYR CONTROL 10ML LL (SYRINGE) IMPLANT
TOWEL OR 17X24 6PK STRL BLUE (TOWEL DISPOSABLE) ×2 IMPLANT
TOWEL OR 17X26 10 PK STRL BLUE (TOWEL DISPOSABLE) ×2 IMPLANT
TUBE ANAEROBIC SPECIMEN COL (MISCELLANEOUS) IMPLANT
TUBE CONNECTING 12X1/4 (SUCTIONS) ×2 IMPLANT
UNDERPAD 30X30 INCONTINENT (UNDERPADS AND DIAPERS) ×2 IMPLANT
WATER STERILE IRR 1000ML POUR (IV SOLUTION) ×2 IMPLANT
YANKAUER SUCT BULB TIP NO VENT (SUCTIONS) ×2 IMPLANT

## 2015-02-02 NOTE — Discharge Summary (Signed)
Physician Discharge Summary  Patient ID: Timothy Li MRN: [id number] DOB/AGE: 04/07/1975 40 y.o.  Admit date: 01/29/2015 Discharge date: 02/02/2015  Admission Diagnoses:  Left hand abscess  H/o smoking  Discharge Diagnoses:  Active Problems:   Abscess of left hand including fingers s/p I and D of left hand abscess  Discharged Condition: poor  Hospital Course:  The patient was admitted on 1/14 and taken to the OR.  He underwent I and D of his hand abscess and was administered IV abx.  He was taken to 5N post op with a plan of continued IV abx and repeat I and D in 1-2 days.  He then chose to leave the hospital against medical advice.  Consults: None  Significant Diagnostic Studies: none  Treatments: surgery: as above  Discharge Exam: Blood pressure 128/76, pulse 76, temperature 98.7 F (37.1 C), temperature source Oral, resp. rate 20, height  (1.651 m), weight 72.576 kg (160 lb), SpO2 94 %. wn wd male in nad.  L hand immoblized in a volar splint and bulky dressing.  NVI at fingers.  Disposition: left AMA.     Medication List    ASK your doctor about these medications        clindamycin 300 MG capsule  Commonly known as:  CLEOCIN  Take 1 capsule (300 mg total) by mouth 3 (three) times daily.     meloxicam 15 MG tablet  Commonly known as:  MOBIC  Take 15 mg by mouth daily.     oxyCODONE-acetaminophen 5-325 MG tablet  Commonly known as:  ROXICET  Take 1 tablet by mouth every 4 (four) hours as needed for severe pain.     traMADol 50 MG tablet  Commonly known as:  ULTRAM  Take 1 tablet (50 mg total) by mouth every 12 (twelve) hours as needed.         SignedToni Arthurs 02/02/2015, 4:31 PM

## 2015-02-02 NOTE — Transfer of Care (Signed)
Immediate Anesthesia Transfer of Care Note  Patient: Timothy Li  Procedure(s) Performed: Procedure(s): LEFT INDEX FINGER INCISION AND DRAINAGE AND DEBRIDEMENT (Left)  Patient Location: PACU  Anesthesia Type:General  Level of Consciousness: awake, alert  and oriented  Airway & Oxygen Therapy: Patient Spontanous Breathing and Patient connected to nasal cannula oxygen  Post-op Assessment: Report given to RN and Post -op Vital signs reviewed and stable  Post vital signs: Reviewed and stable  Last Vitals:  Filed Vitals:   02/02/15 1533  BP: 112/57  Pulse: 56  Temp: 36.9 C  Resp: 18    Complications: No apparent anesthesia complications

## 2015-02-02 NOTE — H&P (Cosign Needed)
Timothy Li is an 40 y.o. male.   Chief Complaint: left index infection HPI: Pt well known to service with infection left index finger, had incision and drainage on Saturday and here today for repeat I/D. No prior surgery to left index finger prior to these events this past week.  Past Medical History  Diagnosis Date  . Arthritis     Past Surgical History  Procedure Laterality Date  . Knee surgery Left   . Wrist surgery    . Finger surgery    . I&d extremity Left 01/29/2015    Procedure: IRRIGATION AND DEBRIDEMENT  INDEX FINGER;  Surgeon: Toni Arthurs, MD;  Location: [facility] OR;  Service: Orthopedics;  Laterality: Left;    History reviewed. No pertinent family history. Social History:  reports that he has been smoking.  He does not have any smokeless tobacco history on file. He reports that he does not drink alcohol or use illicit drugs.  Allergies: No Known Allergies  Medications Prior to Admission  Medication Sig Dispense Refill  . clindamycin (CLEOCIN) 300 MG capsule Take 1 capsule (300 mg total) by mouth 3 (three) times daily. 30 capsule 0  . meloxicam (MOBIC) 15 MG tablet Take 15 mg by mouth daily.  3  . oxyCODONE-acetaminophen (ROXICET) 5-325 MG tablet Take 1 tablet by mouth every 4 (four) hours as needed for severe pain. 30 tablet 0  . traMADol (ULTRAM) 50 MG tablet Take 1 tablet (50 mg total) by mouth every 12 (twelve) hours as needed. 60 tablet 3    No results found for this or any previous visit (from the past 48 hour(s)). No results found.  ROS NO RECENT ILLNESSES OR HOSPITALIZATIONS  Blood pressure 112/57, pulse 56, temperature 98.5 F (36.9 C), temperature source Oral, resp. rate 18, height  (1.651 m), weight 72.576 kg (160 lb), SpO2 99 %. Physical Exam  General Appearance:  Alert, cooperative, no distress, appears stated age  Head:  Normocephalic, without obvious abnormality, atraumatic  Eyes:  Pupils equal, conjunctiva/corneas clear,         Throat:  Lips, mucosa, and tongue normal; teeth and gums normal  Neck: No visible masses     Lungs:   respirations unlabored  Chest Wall:  No tenderness or deformity  Heart:  Regular rate and rhythm,  Abdomen:   Soft, non-tender,         Extremities: LEFT INDEX FINGER IN BANDAGE, FINGER TIP WARM WELL PERFUSED ABLE TO GENTLY WIGGLE FINGERS FINGER TIPS WARM WELL PERFUSED  Pulses: 2+ and symmetric  Skin: Skin color, texture, turgor normal, no rashes or lesions     Neurologic: Normal    Assessment/Plan LEFT INDEX FINGER ABSCESS  LEFT INDEX FINGER INCISION AND DRAINAGE AND DEBRIDEMENT AS INDICATED  R/B/A DISCUSSED WITH PT IN OFFICE.  PT VOICED UNDERSTANDING OF PLAN CONSENT SIGNED DAY OF SURGERY PT SEEN AND EXAMINED PRIOR TO OPERATIVE PROCEDURE/DAY OF SURGERY SITE MARKED. QUESTIONS ANSWERED WILL GO HOME FOLLOWING SURGERY  WE ARE PLANNING SURGERY FOR YOUR UPPER EXTREMITY. THE RISKS AND BENEFITS OF SURGERY INCLUDE BUT NOT LIMITED TO BLEEDING INFECTION, DAMAGE TO NEARBY NERVES ARTERIES TENDONS, FAILURE OF SURGERY TO ACCOMPLISH ITS INTENDED GOALS, PERSISTENT SYMPTOMS AND NEED FOR FURTHER SURGICAL INTERVENTION. WITH THIS IN MIND WE WILL PROCEED. I HAVE DISCUSSED WITH THE PATIENT THE PRE AND POSTOPERATIVE REGIMEN AND THE DOS AND DON'TS. PT VOICED UNDERSTANDING AND INFORMED CONSENT SIGNED.  Sharma Covert 02/02/2015, 5:23 PM

## 2015-02-02 NOTE — Anesthesia Preprocedure Evaluation (Addendum)
Anesthesia Evaluation  Patient identified by MRN, date of birth, ID band Patient awake    Reviewed: Allergy & Precautions, NPO status , Patient's Chart, lab work & pertinent test results  Airway Mallampati: I  TM Distance: >3 FB Neck ROM: Full    Dental   Pulmonary Current Smoker,    Pulmonary exam normal        Cardiovascular negative cardio ROS Normal cardiovascular exam     Neuro/Psych negative neurological ROS  negative psych ROS   GI/Hepatic negative GI ROS, Neg liver ROS,   Endo/Other  negative endocrine ROS  Renal/GU negative Renal ROS  negative genitourinary   Musculoskeletal  (+) Arthritis ,   Abdominal   Peds negative pediatric ROS (+)  Hematology negative hematology ROS (+)   Anesthesia Other Findings   Reproductive/Obstetrics negative OB ROS                            Lab Results  Component Value Date   WBC 18.7* 01/29/2015   HGB 12.4* 01/29/2015   HCT 37.3* 01/29/2015   MCV 93.3 01/29/2015   PLT 294 01/29/2015   Lab Results  Component Value Date   CREATININE 0.65 01/29/2015   BUN 6 01/29/2015   NA 138 01/29/2015   K 4.0 01/29/2015   CL 101 01/29/2015   CO2 25 01/29/2015   No results found for: INR, PROTIME   Anesthesia Physical Anesthesia Plan  ASA: II  Anesthesia Plan: General   Post-op Pain Management:    Induction: Intravenous  Airway Management Planned: LMA  Additional Equipment:   Intra-op Plan:   Post-operative Plan: Extubation in OR  Informed Consent: I have reviewed the patients History and Physical, chart, labs and discussed the procedure including the risks, benefits and alternatives for the proposed anesthesia with the patient or authorized representative who has indicated his/her understanding and acceptance.   Dental advisory given  Plan Discussed with: CRNA and Surgeon  Anesthesia Plan Comments:        Anesthesia Quick  Evaluation

## 2015-02-02 NOTE — Discharge Instructions (Signed)
KEEP BANDAGE CLEAN AND DRY °CALL OFFICE FOR F/U APPT [phone] IN 2 DAYS °KEEP HAND ELEVATED ABOVE HEART °OK TO APPLY ICE TO OPERATIVE AREA °CONTACT OFFICE IF ANY WORSENING PAIN OR CONCERNS. °

## 2015-02-02 NOTE — Brief Op Note (Signed)
02/02/2015  5:25 PM  PATIENT:  Timothy Li  40 y.o. male  PRE-OPERATIVE DIAGNOSIS:  LEFT INDEX FINGER ABSCESS  POST-OPERATIVE DIAGNOSIS:  * No post-op diagnosis entered *  PROCEDURE:  Procedure(s): LEFT INDEX FINGER INCISION AND DRAINAGE AND DEBRIDEMENT (Left)  SURGEON:  Surgeon(s) and Role:    * Bradly Bienenstock, MD - Primary  PHYSICIAN ASSISTANT:   ASSISTANTS: none   ANESTHESIA:   general  EBL:     BLOOD ADMINISTERED:none  DRAINS: none   LOCAL MEDICATIONS USED:  NONE  SPECIMEN:  No Specimen  DISPOSITION OF SPECIMEN:  N/A  COUNTS:  YES  TOURNIQUET:    DICTATION: .Other Dictation: Dictation Number [id number]  PLAN OF CARE: Discharge to home after PACU  PATIENT DISPOSITION:  PACU - hemodynamically stable.   Delay start of Pharmacological VTE agent (>24hrs) due to surgical blood loss or risk of bleeding: not applicable

## 2015-02-02 NOTE — Op Note (Signed)
Timothy Li, Timothy Li              ACCOUNT NO.:  [id number]  MEDICAL RECORD NO.:  [id number]  LOCATION:  [facility]                         FACILITY:  [facility]  PHYSICIAN:  Sharma Covert IV, M.D.DATE OF BIRTH:  [birth year]  DATE OF PROCEDURE:  02/02/2015 DATE OF DISCHARGE:                              OPERATIVE REPORT   PREOPERATIVE DIAGNOSIS:  Left index finger abscess.  POSTOPERATIVE DIAGNOSIS:  Left index finger abscess.  SURGEON:  Sharma Covert, M.D., who scrubbed and present for the entire procedure.  ASSISTANT SURGEON:  None.  ANESTHESIA:  General via LMA.  PROCEDURE: 1. Excisional debridement of skin and subcutaneous tissues in extensor     mechanism and down to the bone, left index finger. 2. Left index finger incision and drainage of the dorsal finger     abscess.  COMPLICATIONS:  None.  DISPOSITION:  Extubated, awake, and stable to recovery.  INDICATIONS FOR PROCEDURE:  A 40 year old gentleman, who was taken to the operating room on January 29, 2015, for an infection.  The patient was seen and evaluated in office, recommended to undergo repeat I and D's.  Risks, benefits, and alternatives were discussed in detail.  These include, but not limited to bleeding; infection; damage to nearby nerves, arteries, tendons; loss of motion of wrist and digits; incomplete relief of symptoms; need for further surgical intervention; and potential finger loss.  DESCRIPTION OF PROCEDURE:  The patient was properly identified in the preoperative holding area, marked with a permanent marker made on the left index finger to indicate the correct operative site.  The patient was brought back to the operating room, placed supine on the operating table.  General anesthesia was induced.  Preop antibiotics were administered.  Time-out was called, correct side was identified, and procedure then begun.  Left upper extremity was then prepped and draped in normal sterile fashion.  Longitudinal  incision was made and dorsal abscess was then drained.  Thorough wound irrigation and drainage of the dorsal index finger abscess was then carried out.  Following this, skin flaps were then carefully elevated.  An excisional debridement of skin, subcu tissue and all the way down to the extensor tendon down to bone was then carried out of the necrotic area.  This was sharply excised with scissors and knife.  Portion of periosteum was then carried all the way down to the joint level.  The wound was then thoroughly irrigated. Thorough wound irrigation Li.  It did not appear to be tracking along the flexor sheath.  All necrotic tissue visible had been carefully removed.  The wound was irrigated.  After thorough wound irrigation, the skin was then loosely reapproximated to cover the extensor mechanism and cover the joint with 4-0 Prolene sutures.  Adaptic dressing and sterile compressive bandage were applied.  The patient tolerated the procedure well, returned to recovery room in good condition.  POSTPROCEDURE PLAN:  The patient is going to be discharged home, seen back in the office in 2 days.  The patient may require repeat I and D depending on the status of the finger, and then the patient may also require further skin coverage depending on how the tissue heals.  If he  is improving, he may require repeat I and D's in the operating room as he continues to heal.     Timothy Li, M.D.     FWO/MEDQ  D:  02/02/2015  T:  02/02/2015  Job:  [id number]

## 2015-02-03 ENCOUNTER — Encounter (HOSPITAL_COMMUNITY): Payer: Self-pay | Admitting: Orthopedic Surgery

## 2015-02-03 DIAGNOSIS — L02512 Cutaneous abscess of left hand: Secondary | ICD-10-CM | POA: Diagnosis not present

## 2015-02-03 MED FILL — OXYCODONE/APAP 5-325: 5-325 | 5 days supply | Qty: 30 | Fill #0

## 2015-02-03 MED FILL — CLINDAMYCIN HCL 300 MG CAP: 300 | 10 days supply | Qty: 31 | Fill #0

## 2015-02-03 NOTE — Anesthesia Postprocedure Evaluation (Signed)
Anesthesia Post Note  Patient: Timothy Li  Procedure(s) Performed: Procedure(s) (LRB): LEFT INDEX FINGER INCISION AND DRAINAGE AND DEBRIDEMENT (Left)  Patient location during evaluation: PACU Anesthesia Type: General Level of consciousness: awake Pain management: pain level controlled Vital Signs Assessment: post-procedure vital signs reviewed and stable Respiratory status: spontaneous breathing and respiratory function stable Cardiovascular status: stable Postop Assessment: no signs of nausea or vomiting Anesthetic complications: no    Last Vitals:  Filed Vitals:   02/02/15 1900 02/02/15 1915  BP: 139/81 151/83  Pulse: 60 57  Temp:  36.4 C  Resp: 8 10    Last Pain:  Filed Vitals:   02/02/15 1951  PainSc: 8                  Gini Caputo

## 2015-02-04 ENCOUNTER — Ambulatory Visit (HOSPITAL_COMMUNITY): Payer: 59 | Admitting: Anesthesiology

## 2015-02-04 ENCOUNTER — Ambulatory Visit (HOSPITAL_COMMUNITY)
Admission: AD | Admit: 2015-02-04 | Discharge: 2015-02-04 | Disposition: A | Discharge status: Home / Self Care | Payer: 59 | Source: Ambulatory Visit | Attending: Orthopedic Surgery | Admitting: Orthopedic Surgery

## 2015-02-04 ENCOUNTER — Encounter (HOSPITAL_COMMUNITY): Payer: Self-pay | Admitting: *Deleted

## 2015-02-04 ENCOUNTER — Encounter (HOSPITAL_COMMUNITY)
Admission: AD | Disposition: A | Discharge status: Home / Self Care | Payer: Self-pay | Source: Ambulatory Visit | Attending: Orthopedic Surgery

## 2015-02-04 DIAGNOSIS — Z79891 Long term (current) use of opiate analgesic: Secondary | ICD-10-CM | POA: Insufficient documentation

## 2015-02-04 DIAGNOSIS — F17200 Nicotine dependence, unspecified, uncomplicated: Secondary | ICD-10-CM | POA: Insufficient documentation

## 2015-02-04 DIAGNOSIS — Z791 Long term (current) use of non-steroidal anti-inflammatories (NSAID): Secondary | ICD-10-CM | POA: Insufficient documentation

## 2015-02-04 DIAGNOSIS — L02512 Cutaneous abscess of left hand: Secondary | ICD-10-CM

## 2015-02-04 DIAGNOSIS — Z79899 Other long term (current) drug therapy: Secondary | ICD-10-CM | POA: Insufficient documentation

## 2015-02-04 DIAGNOSIS — Z792 Long term (current) use of antibiotics: Secondary | ICD-10-CM | POA: Diagnosis not present

## 2015-02-04 DIAGNOSIS — M1990 Unspecified osteoarthritis, unspecified site: Secondary | ICD-10-CM

## 2015-02-04 HISTORY — PX: I&D EXTREMITY: SHX5045

## 2015-02-04 LAB — CULTURE, ROUTINE-ABSCESS

## 2015-02-04 SURGERY — IRRIGATION AND DEBRIDEMENT EXTREMITY
Anesthesia: General | Site: Hand | Laterality: Left

## 2015-02-04 SURGERY — IRRIGATION AND DEBRIDEMENT EXTREMITY
Anesthesia: General | Laterality: Left

## 2015-02-04 MED ORDER — 0.9 % SODIUM CHLORIDE (POUR BTL) OPTIME
TOPICAL | Status: DC | PRN
  Administered 2015-02-04: 1000 mL

## 2015-02-04 MED ORDER — FENTANYL CITRATE (PF) 100 MCG/2ML IJ SOLN
INTRAMUSCULAR | Status: DC | PRN
  Administered 2015-02-04 (×2): 25 ug via INTRAVENOUS
  Administered 2015-02-04 (×5): 50 ug via INTRAVENOUS

## 2015-02-04 MED ORDER — PROPOFOL 10 MG/ML IV BOLUS
INTRAVENOUS | Status: DC | PRN
  Administered 2015-02-04: 200 mg via INTRAVENOUS
  Administered 2015-02-04: 50 mg via INTRAVENOUS

## 2015-02-04 MED ORDER — LACTATED RINGERS IV SOLN: INTRAVENOUS | Status: DC

## 2015-02-04 MED ORDER — HYDROMORPHONE HCL 1 MG/ML IJ SOLN
0.2500 mg | INTRAMUSCULAR | Status: DC | PRN
  Administered 2015-02-04 (×2): 0.5 mg via INTRAVENOUS

## 2015-02-04 MED ORDER — ONDANSETRON HCL 4 MG/2ML IJ SOLN
INTRAMUSCULAR | Status: DC | PRN
  Administered 2015-02-04: 4 mg via INTRAVENOUS

## 2015-02-04 MED ORDER — CLINDAMYCIN PHOSPHATE 900 MG/50ML IV SOLN
900.0000 mg | INTRAVENOUS | Status: DC
Start: 2015-02-04 — End: 2015-02-04
  Administered 2015-02-04: 900 mg via INTRAVENOUS

## 2015-02-04 MED ORDER — MEPERIDINE HCL 50 MG/ML IJ SOLN: 6.2500 mg | INTRAMUSCULAR | Status: DC | PRN

## 2015-02-04 MED ORDER — LIDOCAINE HCL (CARDIAC) 20 MG/ML IV SOLN
INTRAVENOUS | Status: DC | PRN
  Administered 2015-02-04: 60 mg via INTRATRACHEAL

## 2015-02-04 MED ORDER — MIDAZOLAM HCL 5 MG/5ML IJ SOLN
INTRAMUSCULAR | Status: DC | PRN
Start: 2015-02-04 — End: 2015-02-04
  Administered 2015-02-04: 2 mg via INTRAVENOUS

## 2015-02-04 MED ORDER — PROMETHAZINE HCL 25 MG/ML IJ SOLN: 6.2500 mg | INTRAMUSCULAR | Status: DC | PRN

## 2015-02-04 MED ORDER — SODIUM CHLORIDE 0.9 % IR SOLN
Status: DC | PRN
  Administered 2015-02-04: 3000 mL

## 2015-02-04 MED ORDER — CHLORHEXIDINE GLUCONATE 4 % EX LIQD: 60.0000 mL | Freq: Once | CUTANEOUS | Status: DC

## 2015-02-04 MED ORDER — LACTATED RINGERS IV SOLN
INTRAVENOUS | Status: DC
  Administered 2015-02-04: 17:00:00 via INTRAVENOUS

## 2015-02-04 MED ORDER — HYDROMORPHONE HCL 1 MG/ML IJ SOLN
INTRAMUSCULAR | Status: AC
  Filled 2015-02-04: qty 1

## 2015-02-04 SURGICAL SUPPLY — 44 items
BAG ZIPLOCK 12X15 (MISCELLANEOUS) IMPLANT
BANDAGE ACE 3X5.8 VEL STRL LF (GAUZE/BANDAGES/DRESSINGS) ×3 IMPLANT
BANDAGE ACE 4X5 VEL STRL LF (GAUZE/BANDAGES/DRESSINGS) ×3 IMPLANT
BLADE SURG SZ10 CARB STEEL (BLADE) ×3 IMPLANT
BNDG CONFORM 4 STRL LF (GAUZE/BANDAGES/DRESSINGS) ×3 IMPLANT
BNDG ESMARK 4X9 LF (GAUZE/BANDAGES/DRESSINGS) ×3 IMPLANT
BNDG GAUZE ELAST 4 BULKY (GAUZE/BANDAGES/DRESSINGS) ×3 IMPLANT
CUFF TOURN SGL QUICK 18 (TOURNIQUET CUFF) ×3 IMPLANT
DRAIN PENROSE 18X1/2 LTX STRL (DRAIN) ×3 IMPLANT
DRAPE SURG 17X11 SM STRL (DRAPES) ×3 IMPLANT
DRSG EMULSION OIL 3X3 NADH (GAUZE/BANDAGES/DRESSINGS) ×3 IMPLANT
DRSG PAD ABDOMINAL 8X10 ST (GAUZE/BANDAGES/DRESSINGS) ×3 IMPLANT
ELECT REM PT RETURN 9FT ADLT (ELECTROSURGICAL)
ELECTRODE REM PT RTRN 9FT ADLT (ELECTROSURGICAL) IMPLANT
GAUZE SPONGE 4X4 12PLY STRL (GAUZE/BANDAGES/DRESSINGS) ×3 IMPLANT
GLOVE SURG ORTHO 8.0 STRL STRW (GLOVE) ×3 IMPLANT
GLOVE SURG SS PI 8.0 STRL IVOR (GLOVE) ×3 IMPLANT
GLOVE SURG SS PI 8.5 STRL IVOR (GLOVE) ×6
GLOVE SURG SS PI 8.5 STRL STRW (GLOVE) ×3 IMPLANT
GOWN STRL REUS W/TWL XL LVL3 (GOWN DISPOSABLE) ×6 IMPLANT
IV LACTATED RINGER IRRG 3000ML (IV SOLUTION)
IV LR IRRIG 3000ML ARTHROMATIC (IV SOLUTION) IMPLANT
IV NS IRRIG 3000ML ARTHROMATIC (IV SOLUTION) ×3 IMPLANT
KIT BASIN OR (CUSTOM PROCEDURE TRAY) ×3 IMPLANT
MANIFOLD NEPTUNE II (INSTRUMENTS) ×3 IMPLANT
PACK ORTHO EXTREMITY (CUSTOM PROCEDURE TRAY) ×3 IMPLANT
PAD CAST 4YDX4 CTTN HI CHSV (CAST SUPPLIES) ×1 IMPLANT
PADDING CAST COTTON 2X4 NS (CAST SUPPLIES) ×3 IMPLANT
PADDING CAST COTTON 4X4 STRL (CAST SUPPLIES) ×2
POSITIONER SURGICAL ARM (MISCELLANEOUS) ×3 IMPLANT
SET CYSTO W/LG BORE CLAMP LF (SET/KITS/TRAYS/PACK) ×3 IMPLANT
SLING ARM MED ADULT FOAM STRAP (SOFTGOODS) ×3 IMPLANT
SOL PREP POV-IOD 4OZ 10% (MISCELLANEOUS) ×3 IMPLANT
SOL PREP PROV IODINE SCRUB 4OZ (MISCELLANEOUS) IMPLANT
SPLINT FIBERGLASS 4X30 (CAST SUPPLIES) ×3 IMPLANT
SUT PROLENE 3 0 PS 2 (SUTURE) ×3 IMPLANT
SUT VIC AB 1 CT1 27 (SUTURE)
SUT VIC AB 1 CT1 27XBRD ANTBC (SUTURE) IMPLANT
SUT VIC AB 2-0 CT1 27 (SUTURE)
SUT VIC AB 2-0 CT1 27XBRD (SUTURE) IMPLANT
SYR 20CC LL (SYRINGE) ×3 IMPLANT
SYR CONTROL 10ML LL (SYRINGE) ×3 IMPLANT
TOWEL OR 17X26 10 PK STRL BLUE (TOWEL DISPOSABLE) ×3 IMPLANT
YANKAUER SUCT BULB TIP NO VENT (SUCTIONS) ×3 IMPLANT

## 2015-02-04 NOTE — Anesthesia Postprocedure Evaluation (Signed)
Anesthesia Post Note  Patient: Timothy Li  Procedure(s) Performed: Procedure(s) (LRB): IRRIGATION AND DEBRIDEMENT LEFT INDEX FINGER (Left)  Patient location during evaluation: PACU Anesthesia Type: General Level of consciousness: awake and alert Pain management: pain level controlled Vital Signs Assessment: post-procedure vital signs reviewed and stable Respiratory status: spontaneous breathing, nonlabored ventilation, respiratory function stable and patient connected to nasal cannula oxygen Cardiovascular status: blood pressure returned to baseline and stable Postop Assessment: no signs of nausea or vomiting Anesthetic complications: no    Last Vitals:  Filed Vitals:   02/04/15 1830 02/04/15 1840  BP: 130/82 123/81  Pulse: 85 71  Temp:  37 C  Resp: 12 12    Last Pain:  Filed Vitals:   02/04/15 1841  PainSc: 0-No pain                 Shelton Silvas

## 2015-02-04 NOTE — Anesthesia Procedure Notes (Signed)
Procedure Name: LMA Insertion Date/Time: 02/04/2015 5:19 PM Performed by: Delphia Grates Pre-anesthesia Checklist: Patient identified, Emergency Drugs available, Suction available and Patient being monitored Patient Re-evaluated:Patient Re-evaluated prior to inductionOxygen Delivery Method: Circle system utilized Preoxygenation: Pre-oxygenation with 100% oxygen Intubation Type: IV induction LMA: LMA inserted LMA Size: 4.0 Number of attempts: 1 Tube secured with: Tape Dental Injury: Teeth and Oropharynx as per pre-operative assessment

## 2015-02-04 NOTE — Anesthesia Preprocedure Evaluation (Addendum)
Anesthesia Evaluation  Patient identified by MRN, date of birth, ID band Patient awake    Reviewed: Allergy & Precautions, NPO status , Patient's Chart, lab work & pertinent test results  Airway Mallampati: I  TM Distance: >3 FB Neck ROM: Full    Dental  (+) Teeth Intact   Pulmonary Current Smoker,    breath sounds clear to auscultation       Cardiovascular negative cardio ROS   Rhythm:Regular Rate:Normal     Neuro/Psych negative neurological ROS  negative psych ROS   GI/Hepatic negative GI ROS, Neg liver ROS,   Endo/Other  negative endocrine ROS  Renal/GU negative Renal ROS  negative genitourinary   Musculoskeletal  (+) Arthritis ,   Abdominal   Peds negative pediatric ROS (+)  Hematology negative hematology ROS (+)   Anesthesia Other Findings   Reproductive/Obstetrics negative OB ROS                            Lab Results  Component Value Date   WBC 18.7* 01/29/2015   HGB 12.4* 01/29/2015   HCT 37.3* 01/29/2015   MCV 93.3 01/29/2015   PLT 294 01/29/2015   Lab Results  Component Value Date   CREATININE 0.65 01/29/2015   BUN 6 01/29/2015   NA 138 01/29/2015   K 4.0 01/29/2015   CL 101 01/29/2015   CO2 25 01/29/2015     Anesthesia Physical Anesthesia Plan  ASA: II and emergent  Anesthesia Plan: General   Post-op Pain Management:    Induction: Intravenous  Airway Management Planned: Oral ETT and LMA  Additional Equipment:   Intra-op Plan:   Post-operative Plan: Extubation in OR  Informed Consent: I have reviewed the patients History and Physical, chart, labs and discussed the procedure including the risks, benefits and alternatives for the proposed anesthesia with the patient or authorized representative who has indicated his/her understanding and acceptance.   Dental advisory given  Plan Discussed with: CRNA  Anesthesia Plan Comments: (Pt states has been  NPO for approx 12 hours. He did not eat or drink anything "since a little before 6 AM." )       Anesthesia Quick Evaluation

## 2015-02-04 NOTE — Progress Notes (Signed)
Patient requested to have pain medication. Nurse called Dr. Glenna Durand office and spoke with Morrie Sheldon and informed her of patients request to have pain medication. Morrie Sheldon placed Nurse on hold and stated she would ask Dr. Melvyn Novas. Morrie Sheldon then informed Nurse that Dr. Melvyn Novas stated that [facility] would pick patient up and transport him to [facility], and he would address the situation when patient arrived. Nurse informed patient of this. Patient then called his ride and requested that he be picked up from [facility].

## 2015-02-04 NOTE — Transfer of Care (Signed)
Immediate Anesthesia Transfer of Care Note  Patient: Timothy Li  Procedure(s) Performed: Procedure(s): IRRIGATION AND DEBRIDEMENT LEFT INDEX FINGER (Left)  Patient Location: PACU  Anesthesia Type:General  Level of Consciousness: awake, alert  and patient cooperative  Airway & Oxygen Therapy: Patient Spontanous Breathing and Patient connected to face mask oxygen  Post-op Assessment: Report given to RN and Post -op Vital signs reviewed and stable  Post vital signs: Reviewed and stable  Last Vitals:  Filed Vitals:   02/04/15 1529 02/04/15 1530  BP: 125/75   Pulse: 65   Temp: 36.4 C   Resp:  16    Complications: No apparent anesthesia complications

## 2015-02-04 NOTE — Brief Op Note (Signed)
02/04/2015  5:16 PM  PATIENT:  Timothy Li  40 y.o. male  PRE-OPERATIVE DIAGNOSIS:  infected left index finger  POST-OPERATIVE DIAGNOSIS:  * No post-op diagnosis entered *  PROCEDURE:  Procedure(s): IRRIGATION AND DEBRIDEMENT LEFT INDEX FINGER (Left)  SURGEON:  Surgeon(s) and Role:    * Bradly Bienenstock, MD - Primary  PHYSICIAN ASSISTANT:   ASSISTANTS: none   ANESTHESIA:   general  EBL:  Total I/O In: -  Out: 1000 [Urine:1000]  BLOOD ADMINISTERED:none  DRAINS: none   LOCAL MEDICATIONS USED:  NONE  SPECIMEN:  No Specimen  DISPOSITION OF SPECIMEN:  N/A  COUNTS:  YES  TOURNIQUET:    DICTATION: .Other Dictation: Dictation Number [id number]  PLAN OF CARE: Discharge to home after PACU  PATIENT DISPOSITION:  PACU - hemodynamically stable.   Delay start of Pharmacological VTE agent (>24hrs) due to surgical blood loss or risk of bleeding: not applicable

## 2015-02-04 NOTE — Discharge Instructions (Signed)
KEEP BANDAGE CLEAN AND DRY CALL OFFICE FOR F/U APPT [id number] on Monday am 02/07/2015 KEEP HAND ELEVATED ABOVE HEART OK TO APPLY ICE TO OPERATIVE AREA CONTACT OFFICE IF ANY WORSENING PAIN OR CONCERNS.

## 2015-02-04 NOTE — H&P (Signed)
Timothy Li is an 40 y.o. male.  Chief Complaint: left index infection HPI: Pt well known to service with infection left index finger, had incision and drainage on Wednesday and here today for repeat I/D. No prior surgery to left index finger prior to these events this past week.  Past Medical History  Diagnosis Date  . Arthritis     Past Surgical History  Procedure Laterality Date  . Knee surgery Left   . Wrist surgery    . Finger surgery    . I&d extremity Left 01/29/2015    Procedure: IRRIGATION AND DEBRIDEMENT INDEX FINGER; Surgeon: Toni Arthurs, MD; Location: [facility] OR; Service: Orthopedics; Laterality: Left;    History reviewed. No pertinent family history. Social History:  reports that he has been smoking. He does not have any smokeless tobacco history on file. He reports that he does not drink alcohol or use illicit drugs.  Allergies: No Known Allergies  Medications Prior to Admission  Medication Sig Dispense Refill  . clindamycin (CLEOCIN) 300 MG capsule Take 1 capsule (300 mg total) by mouth 3 (three) times daily. 30 capsule 0  . meloxicam (MOBIC) 15 MG tablet Take 15 mg by mouth daily.  3  . oxyCODONE-acetaminophen (ROXICET) 5-325 MG tablet Take 1 tablet by mouth every 4 (four) hours as needed for severe pain. 30 tablet 0  . traMADol (ULTRAM) 50 MG tablet Take 1 tablet (50 mg total) by mouth every 12 (twelve) hours as needed. 60 tablet 3     Lab Results Last 48 Hours    No results found for this or any previous visit (from the past 48 hour(s)).    Imaging Results (Last 48 hours)    No results found.    ROS NO RECENT ILLNESSES OR HOSPITALIZATIONS  Blood pressure 112/57, pulse 56, temperature 98.5 F (36.9 C), temperature source Oral, resp. rate 18, height  (1.651 m), weight 72.576 kg (160 lb), SpO2 99 %. Physical Exam  General Appearance:  Alert, cooperative, no distress, appears stated  age  Head:  Normocephalic, without obvious abnormality, atraumatic  Eyes:  Pupils equal, conjunctiva/corneas clear,         Throat: Lips, mucosa, and tongue normal; teeth and gums normal  Neck: No visible masses     Lungs:  respirations unlabored  Chest Wall:  No tenderness or deformity  Heart:  Regular rate and rhythm,  Abdomen:  Soft, non-tender,         Extremities: LEFT INDEX FINGER IN BANDAGE, FINGER TIP WARM WELL PERFUSED ABLE TO GENTLY WIGGLE FINGERS FINGER TIPS WARM WELL PERFUSED  Pulses: 2+ and symmetric  Skin: Skin color, texture, turgor normal, no rashes or lesions     Neurologic: Normal    Assessment/Plan LEFT INDEX FINGER ABSCESS  LEFT INDEX FINGER INCISION AND DRAINAGE AND DEBRIDEMENT AS INDICATED  R/B/A DISCUSSED WITH PT IN OFFICE.  PT VOICED UNDERSTANDING OF PLAN CONSENT SIGNED DAY OF SURGERY PT SEEN AND EXAMINED PRIOR TO OPERATIVE PROCEDURE/DAY OF SURGERY SITE MARKED. QUESTIONS ANSWERED WILL GO HOME FOLLOWING SURGERY  WE ARE PLANNING SURGERY FOR YOUR UPPER EXTREMITY. THE RISKS AND BENEFITS OF SURGERY INCLUDE BUT NOT LIMITED TO BLEEDING INFECTION, DAMAGE TO NEARBY NERVES ARTERIES TENDONS, FAILURE OF SURGERY TO ACCOMPLISH ITS INTENDED GOALS, PERSISTENT SYMPTOMS AND NEED FOR FURTHER SURGICAL INTERVENTION. WITH THIS IN MIND WE WILL PROCEED. I HAVE DISCUSSED WITH THE PATIENT THE PRE AND POSTOPERATIVE REGIMEN AND THE DOS AND DON'TS. PT VOICED UNDERSTANDING AND INFORMED CONSENT SIGNED.  Sharma Covert 02/04/2015 @  1715      

## 2015-02-05 LAB — ANAEROBIC CULTURE

## 2015-02-05 NOTE — Op Note (Signed)
NAMEFAHED, MORTEN              ACCOUNT NO.:  [id number]  MEDICAL RECORD NO.:  [id number]  LOCATION:  [facility]                         FACILITY:  [facility]  PHYSICIAN:  Sharma Covert IV, M.D.DATE OF BIRTH:  [birth year]  DATE OF PROCEDURE:  02/04/2015 DATE OF DISCHARGE:                              OPERATIVE REPORT   PREOPERATIVE DIAGNOSIS:  Left index finger abscess.  POSTOPERATIVE DIAGNOSIS:  Left index finger abscess.  ATTENDING PHYSICIAN:  Sharma Covert, M.D., who was scrubbed and present for the entire procedure.  ASSISTANT SURGEON:  None.  ANESTHESIA:  General via LMA.  SURGICAL PROCEDURE: 1. Excisional debridement of skin, subcutaneous tissue, and extensor     mechanism down to the bone, left index finger. 2. Left index finger incision and drainage, dorsal finger abscess.  COMPLICATIONS:  None.  DRAINS:  One Penrose drain.  DISPOSITION:  Extubated, awakened, and taken to recovery room in good condition.  SURGICAL INDICATIONS:  Mr. Masud Holub is a 40 year old gentleman, who was taken to the operating room on February 02, 2015 for an infection.  The patient was seen and evaluated in the office, and recommended to undergo repeat I and D.  Risks, benefits, and alternatives were discussed in detail with the patient.  The risks include, but not limited to bleeding; infection; damage to nearby nerves, arteries, or tendons; loss of motion of wrist and digits; incomplete relief of symptoms; need for further surgical intervention; potential finger loss.  DESCRIPTION OF PROCEDURE:  The patient was properly identified in the preoperative holding area, marked with a permanent marker made on left index finger to indicate correct operative site.  The patient was brought back to the operating room, placed supine on the operating table.  General anesthesia was administered.  Preoperative antibiotics were administered.  Time-out was called.  Correct site was identified, and the  procedure then begun.  Left upper extremity was then prepped and draped in sterile fashion.  Longitudinal incision was then opened up dorsally and the abscess area was then drained.  Thorough wound irrigation done.  Excisional debridement was then carried out of the skin and subcutaneous tissue all the way down to the extensor mechanism and all the way down to the MP joint.  The MP joint was then inspected, which did not appear infected.  Joint arthrotomy was then carried out. The wound was then thoroughly irrigated.  Following this, the infection appeared better.  All necrotic visible tissue was carefully removed. The wound was irrigated and the wound was then loosely reapproximated and closed over a drain with a Prolene suture.  Sterile compressive bandage was then applied.  The patient was placed in a well-padded volar splint, extubated and taken to the recovery room in good condition.  POSTOPERATIVE PLAN:  The patient was discharged home, seen back in the office in approximately 3 days for wound check, drain removal, and then likely repeat I and D on Monday depending on the soft tissues.  Continue to be aggressive with the aim of trying to salvage this finger from this very bad soft tissue infection.     [name], M.D.     FWO/MEDQ  D:  02/04/2015  T:  02/05/2015  Job:  [id number]

## 2015-02-07 ENCOUNTER — Encounter (HOSPITAL_COMMUNITY)
Admission: AD | Disposition: A | Discharge status: Home / Self Care | Payer: Self-pay | Source: Ambulatory Visit | Attending: Orthopedic Surgery

## 2015-02-07 ENCOUNTER — Inpatient Hospital Stay (HOSPITAL_COMMUNITY): Payer: 59 | Admitting: Anesthesiology

## 2015-02-07 ENCOUNTER — Ambulatory Visit (HOSPITAL_COMMUNITY)
Admission: AD | Admit: 2015-02-07 | Discharge: 2015-02-07 | Disposition: A | Discharge status: Home / Self Care | Payer: 59 | Source: Ambulatory Visit | Attending: Orthopedic Surgery | Admitting: Orthopedic Surgery

## 2015-02-07 ENCOUNTER — Encounter (HOSPITAL_COMMUNITY): Payer: Self-pay | Admitting: Orthopedic Surgery

## 2015-02-07 DIAGNOSIS — Z792 Long term (current) use of antibiotics: Secondary | ICD-10-CM | POA: Insufficient documentation

## 2015-02-07 DIAGNOSIS — Z791 Long term (current) use of non-steroidal anti-inflammatories (NSAID): Secondary | ICD-10-CM | POA: Insufficient documentation

## 2015-02-07 DIAGNOSIS — Z79899 Other long term (current) drug therapy: Secondary | ICD-10-CM | POA: Insufficient documentation

## 2015-02-07 DIAGNOSIS — F17200 Nicotine dependence, unspecified, uncomplicated: Secondary | ICD-10-CM | POA: Insufficient documentation

## 2015-02-07 DIAGNOSIS — L02512 Cutaneous abscess of left hand: Secondary | ICD-10-CM | POA: Diagnosis not present

## 2015-02-07 DIAGNOSIS — M1990 Unspecified osteoarthritis, unspecified site: Secondary | ICD-10-CM | POA: Insufficient documentation

## 2015-02-07 DIAGNOSIS — Z79891 Long term (current) use of opiate analgesic: Secondary | ICD-10-CM | POA: Insufficient documentation

## 2015-02-07 HISTORY — PX: I&D EXTREMITY: SHX5045

## 2015-02-07 SURGERY — IRRIGATION AND DEBRIDEMENT EXTREMITY
Anesthesia: General | Site: Hand | Laterality: Left

## 2015-02-07 MED ORDER — MIDAZOLAM HCL 2 MG/2ML IJ SOLN: 0.5000 mg | Freq: Once | INTRAMUSCULAR | Status: DC | PRN

## 2015-02-07 MED ORDER — DEXAMETHASONE SODIUM PHOSPHATE 4 MG/ML IJ SOLN
INTRAMUSCULAR | Status: DC | PRN
  Administered 2015-02-07: 4 mg via INTRAVENOUS

## 2015-02-07 MED ORDER — CLINDAMYCIN HCL 300 MG PO CAPS: 300.0000 mg | ORAL_CAPSULE | Freq: Three times a day (TID) | ORAL | Status: DC

## 2015-02-07 MED ORDER — MEPERIDINE HCL 25 MG/ML IJ SOLN: 6.2500 mg | INTRAMUSCULAR | Status: DC | PRN

## 2015-02-07 MED ORDER — ONDANSETRON HCL 4 MG/2ML IJ SOLN
INTRAMUSCULAR | Status: DC | PRN
Start: 2015-02-07 — End: 2015-02-07
  Administered 2015-02-07: 4 mg via INTRAVENOUS

## 2015-02-07 MED ORDER — OXYCODONE-ACETAMINOPHEN 5-325 MG PO TABS
1.0000 | ORAL_TABLET | Freq: Once | ORAL | Status: AC
  Administered 2015-02-07: 1 via ORAL

## 2015-02-07 MED ORDER — FENTANYL CITRATE (PF) 250 MCG/5ML IJ SOLN
INTRAMUSCULAR | Status: AC
Start: 2015-02-07 — End: 2015-02-07
  Filled 2015-02-07: qty 5

## 2015-02-07 MED ORDER — LIDOCAINE HCL (CARDIAC) 20 MG/ML IV SOLN
INTRAVENOUS | Status: AC
  Filled 2015-02-07: qty 10

## 2015-02-07 MED ORDER — PROPOFOL 10 MG/ML IV BOLUS
INTRAVENOUS | Status: AC
  Filled 2015-02-07: qty 20

## 2015-02-07 MED ORDER — OXYCODONE-ACETAMINOPHEN 5-325 MG PO TABS: 1.0000 | ORAL_TABLET | ORAL | Status: DC | PRN

## 2015-02-07 MED ORDER — HYDROMORPHONE HCL 1 MG/ML IJ SOLN
INTRAMUSCULAR | Status: AC
  Filled 2015-02-07: qty 1

## 2015-02-07 MED ORDER — PROPOFOL 10 MG/ML IV BOLUS
INTRAVENOUS | Status: DC | PRN
  Administered 2015-02-07: 200 mg via INTRAVENOUS

## 2015-02-07 MED ORDER — PROMETHAZINE HCL 25 MG/ML IJ SOLN: 6.2500 mg | INTRAMUSCULAR | Status: DC | PRN

## 2015-02-07 MED ORDER — LIDOCAINE HCL (CARDIAC) 20 MG/ML IV SOLN
INTRAVENOUS | Status: DC | PRN
  Administered 2015-02-07: 20 mg via INTRAVENOUS

## 2015-02-07 MED ORDER — HYDROMORPHONE HCL 1 MG/ML IJ SOLN
0.2500 mg | INTRAMUSCULAR | Status: DC | PRN
  Administered 2015-02-07 (×4): 0.5 mg via INTRAVENOUS

## 2015-02-07 MED ORDER — LACTATED RINGERS IV SOLN
INTRAVENOUS | Status: DC
  Administered 2015-02-07: 17:00:00 via INTRAVENOUS

## 2015-02-07 MED ORDER — FENTANYL CITRATE (PF) 100 MCG/2ML IJ SOLN
INTRAMUSCULAR | Status: DC | PRN
  Administered 2015-02-07 (×2): 125 ug via INTRAVENOUS

## 2015-02-07 MED ORDER — MIDAZOLAM HCL 5 MG/5ML IJ SOLN
INTRAMUSCULAR | Status: DC | PRN
  Administered 2015-02-07: 2 mg via INTRAVENOUS

## 2015-02-07 MED ORDER — HYDROMORPHONE HCL 1 MG/ML IJ SOLN
0.5000 mg | INTRAMUSCULAR | Status: DC | PRN
  Administered 2015-02-07 (×2): 0.5 mg via INTRAVENOUS

## 2015-02-07 MED ORDER — VANCOMYCIN HCL IN DEXTROSE 1-5 GM/200ML-% IV SOLN
1000.0000 mg | INTRAVENOUS | Status: AC
  Administered 2015-02-07 (×2): 1000 mg via INTRAVENOUS

## 2015-02-07 MED ORDER — CHLORHEXIDINE GLUCONATE 4 % EX LIQD: 60.0000 mL | Freq: Once | CUTANEOUS | Status: DC

## 2015-02-07 MED ORDER — MIDAZOLAM HCL 2 MG/2ML IJ SOLN
INTRAMUSCULAR | Status: AC
  Filled 2015-02-07: qty 2

## 2015-02-07 MED ORDER — VANCOMYCIN HCL IN DEXTROSE 1-5 GM/200ML-% IV SOLN
INTRAVENOUS | Status: AC
  Administered 2015-02-07: 1000 mg via INTRAVENOUS
  Filled 2015-02-07: qty 200

## 2015-02-07 MED ORDER — OXYCODONE-ACETAMINOPHEN 5-325 MG PO TABS
ORAL_TABLET | ORAL | Status: AC
  Administered 2015-02-07: 1 via ORAL
  Filled 2015-02-07: qty 1

## 2015-02-07 MED ORDER — SODIUM CHLORIDE 0.9 % IR SOLN
Status: DC | PRN
  Administered 2015-02-07: 1000 mL

## 2015-02-07 SURGICAL SUPPLY — 56 items
BANDAGE ACE 3X5.8 VEL STRL LF (GAUZE/BANDAGES/DRESSINGS) ×3 IMPLANT
BANDAGE ELASTIC 3 VELCRO ST LF (GAUZE/BANDAGES/DRESSINGS) IMPLANT
BANDAGE ELASTIC 4 VELCRO ST LF (GAUZE/BANDAGES/DRESSINGS) IMPLANT
BNDG COHESIVE 1X5 TAN STRL LF (GAUZE/BANDAGES/DRESSINGS) IMPLANT
BNDG CONFORM 2 STRL LF (GAUZE/BANDAGES/DRESSINGS) IMPLANT
BNDG ESMARK 4X9 LF (GAUZE/BANDAGES/DRESSINGS) IMPLANT
BNDG GAUZE ELAST 4 BULKY (GAUZE/BANDAGES/DRESSINGS) ×3 IMPLANT
CORDS BIPOLAR (ELECTRODE) ×3 IMPLANT
COVER SURGICAL LIGHT HANDLE (MISCELLANEOUS) ×3 IMPLANT
CUFF TOURNIQUET SINGLE 18IN (TOURNIQUET CUFF) ×3 IMPLANT
CUFF TOURNIQUET SINGLE 24IN (TOURNIQUET CUFF) IMPLANT
DRAIN PENROSE 1/4X12 LTX STRL (WOUND CARE) IMPLANT
DRAPE SURG 17X23 STRL (DRAPES) ×3 IMPLANT
DRSG ADAPTIC 3X8 NADH LF (GAUZE/BANDAGES/DRESSINGS) ×3 IMPLANT
ELECT REM PT RETURN 9FT ADLT (ELECTROSURGICAL)
ELECTRODE REM PT RTRN 9FT ADLT (ELECTROSURGICAL) IMPLANT
GAUZE SPONGE 4X4 12PLY STRL (GAUZE/BANDAGES/DRESSINGS) IMPLANT
GAUZE XEROFORM 1X8 LF (GAUZE/BANDAGES/DRESSINGS) IMPLANT
GAUZE XEROFORM 5X9 LF (GAUZE/BANDAGES/DRESSINGS) IMPLANT
GLOVE BIOGEL PI IND STRL 8.5 (GLOVE) ×1 IMPLANT
GLOVE BIOGEL PI INDICATOR 8.5 (GLOVE) ×2
GLOVE SURG ORTHO 8.0 STRL STRW (GLOVE) ×3 IMPLANT
GOWN STRL REUS W/ TWL LRG LVL3 (GOWN DISPOSABLE) ×1 IMPLANT
GOWN STRL REUS W/ TWL XL LVL3 (GOWN DISPOSABLE) ×1 IMPLANT
GOWN STRL REUS W/TWL LRG LVL3 (GOWN DISPOSABLE) ×2
GOWN STRL REUS W/TWL XL LVL3 (GOWN DISPOSABLE) ×2
HANDPIECE INTERPULSE COAX TIP (DISPOSABLE)
KIT BASIN OR (CUSTOM PROCEDURE TRAY) ×3 IMPLANT
KIT ROOM TURNOVER OR (KITS) ×3 IMPLANT
MANIFOLD NEPTUNE II (INSTRUMENTS) IMPLANT
NEEDLE HYPO 25GX1X1/2 BEV (NEEDLE) IMPLANT
NS IRRIG 1000ML POUR BTL (IV SOLUTION) ×3 IMPLANT
PACK ORTHO EXTREMITY (CUSTOM PROCEDURE TRAY) ×3 IMPLANT
PAD ARMBOARD 7.5X6 YLW CONV (MISCELLANEOUS) ×6 IMPLANT
PAD CAST 4YDX4 CTTN HI CHSV (CAST SUPPLIES) IMPLANT
PADDING CAST COTTON 4X4 STRL (CAST SUPPLIES)
SET HNDPC FAN SPRY TIP SCT (DISPOSABLE) IMPLANT
SOAP 2 % CHG 4 OZ (WOUND CARE) ×3 IMPLANT
SPLINT FIBERGLASS 3X12 (CAST SUPPLIES) ×3 IMPLANT
SPONGE GAUZE 4X4 12PLY STER LF (GAUZE/BANDAGES/DRESSINGS) ×3 IMPLANT
SPONGE LAP 18X18 X RAY DECT (DISPOSABLE) ×3 IMPLANT
SPONGE LAP 4X18 X RAY DECT (DISPOSABLE) IMPLANT
SUCTION FRAZIER TIP 10 FR DISP (SUCTIONS) ×3 IMPLANT
SUT ETHILON 4 0 PS 2 18 (SUTURE) IMPLANT
SUT ETHILON 5 0 P 3 18 (SUTURE)
SUT NYLON ETHILON 5-0 P-3 1X18 (SUTURE) IMPLANT
SUT PROLENE 3 0 PS 2 (SUTURE) ×6 IMPLANT
SYR CONTROL 10ML LL (SYRINGE) IMPLANT
TOWEL OR 17X24 6PK STRL BLUE (TOWEL DISPOSABLE) ×3 IMPLANT
TOWEL OR 17X26 10 PK STRL BLUE (TOWEL DISPOSABLE) ×3 IMPLANT
TUBE ANAEROBIC SPECIMEN COL (MISCELLANEOUS) IMPLANT
TUBE CONNECTING 12'X1/4 (SUCTIONS) ×1
TUBE CONNECTING 12X1/4 (SUCTIONS) ×2 IMPLANT
UNDERPAD 30X30 INCONTINENT (UNDERPADS AND DIAPERS) ×3 IMPLANT
WATER STERILE IRR 1000ML POUR (IV SOLUTION) IMPLANT
YANKAUER SUCT BULB TIP NO VENT (SUCTIONS) IMPLANT

## 2015-02-07 NOTE — Brief Op Note (Signed)
02/07/2015  4:57 PM  PATIENT:  Timothy Li  40 y.o. male  PRE-OPERATIVE DIAGNOSIS:  left index finger infection  POST-OPERATIVE DIAGNOSIS:  * No post-op diagnosis entered *  PROCEDURE:  Procedure(s): IRRIGATION AND DEBRIDEMENT EXTREMITY (Left)  SURGEON:  Surgeon(s) and Role:    * Bradly Bienenstock, MD - Primary  PHYSICIAN ASSISTANT:   ASSISTANTS: none   ANESTHESIA:   general  EBL:     BLOOD ADMINISTERED:none  DRAINS: none   LOCAL MEDICATIONS USED:  NONE  SPECIMEN:  No Specimen  DISPOSITION OF SPECIMEN:  N/A  COUNTS:  YES  TOURNIQUET:    DICTATION: .Other Dictation: Dictation Number [id number]  PLAN OF CARE: Discharge to home after PACU  PATIENT DISPOSITION:  PACU - hemodynamically stable.   Delay start of Pharmacological VTE agent (>24hrs) due to surgical blood loss or risk of bleeding: not applicable

## 2015-02-07 NOTE — Anesthesia Preprocedure Evaluation (Signed)
Anesthesia Evaluation  Patient identified by MRN, date of birth, ID band Patient awake    Reviewed: Allergy & Precautions, NPO status , Patient's Chart, lab work & pertinent test results  History of Anesthesia Complications Negative for: history of anesthetic complications  Airway Mallampati: I  TM Distance: >3 FB Neck ROM: Full    Dental  (+) Dental Advisory Given, Missing   Pulmonary Current Smoker,    breath sounds clear to auscultation       Cardiovascular negative cardio ROS   Rhythm:Regular Rate:Normal     Neuro/Psych negative neurological ROS     GI/Hepatic negative GI ROS, Neg liver ROS,   Endo/Other  negative endocrine ROS  Renal/GU negative Renal ROS     Musculoskeletal negative musculoskeletal ROS (+)   Abdominal   Peds  Hematology negative hematology ROS (+)   Anesthesia Other Findings   Reproductive/Obstetrics                             Anesthesia Physical Anesthesia Plan  ASA: II  Anesthesia Plan: General   Post-op Pain Management:    Induction: Intravenous  Airway Management Planned: LMA  Additional Equipment:   Intra-op Plan:   Post-operative Plan:   Informed Consent: I have reviewed the patients History and Physical, chart, labs and discussed the procedure including the risks, benefits and alternatives for the proposed anesthesia with the patient or authorized representative who has indicated his/her understanding and acceptance.   Dental advisory given  Plan Discussed with: Surgeon and CRNA  Anesthesia Plan Comments: (Plan routine monitors, GA- LMA OK)        Anesthesia Quick Evaluation

## 2015-02-07 NOTE — Progress Notes (Signed)
Patient reports that pain medication is not working patient advised to let Dr. Melvyn Novas know.

## 2015-02-07 NOTE — Progress Notes (Signed)
Patient signed with an "X" on consent d/t being left handed and having to sign right handed. Consent was double witnessed by Modesto Charon and myself.

## 2015-02-07 NOTE — Anesthesia Procedure Notes (Signed)
Procedure Name: LMA Insertion Date/Time: 02/07/2015 6:26 PM Performed by: Fabian November Pre-anesthesia Checklist: Patient identified, Patient being monitored, Timeout performed, Emergency Drugs available and Suction available Patient Re-evaluated:Patient Re-evaluated prior to inductionOxygen Delivery Method: Circle System Utilized Preoxygenation: Pre-oxygenation with 100% oxygen Intubation Type: IV induction Ventilation: Mask ventilation without difficulty LMA: LMA inserted LMA Size: 4.0 Number of attempts: 1 Placement Confirmation: positive ETCO2 and breath sounds checked- equal and bilateral Tube secured with: Tape Dental Injury: Teeth and Oropharynx as per pre-operative assessment

## 2015-02-07 NOTE — Transfer of Care (Signed)
Immediate Anesthesia Transfer of Care Note  Patient: Timothy Li  Procedure(s) Performed: Procedure(s): Repeat IRRIGATION AND DEBRIDEMENT Left Index Finger (Left)  Patient Location: PACU  Anesthesia Type:General  Level of Consciousness: awake, oriented and patient cooperative  Airway & Oxygen Therapy: Patient Spontanous Breathing and Patient connected to nasal cannula oxygen  Post-op Assessment: Report given to RN, Post -op Vital signs reviewed and stable and Patient moving all extremities X 4  Post vital signs: Reviewed and stable  Last Vitals:  Filed Vitals:   02/07/15 1630  BP: 121/81  Pulse: 63  Temp: 37.2 C  Resp: 16    Complications: No apparent anesthesia complications

## 2015-02-07 NOTE — Progress Notes (Signed)
Pt ready to go home, saays his pain is better after the pain pill, now at 6/10, pt's ride is at the hospital, pt does not want to wait for his friend to listen to dc instructions, refuses to listen to all discharge instructions, IV dc'd, pt dressed with assistance from RN, agitated to have assistance, but educated it was for safety reasons, dc'd via wheelchair

## 2015-02-07 NOTE — H&P (Signed)
Timothy Li is an 40 y.o. male.   Chief Complaint: LEFT INDEX FINGER ABSCESS HPI: PT WITH PERSISTENT INFECTION PT HERE FOR REPEAT INCISION AND DRAINAGE PT SEEN/EVALUATED IN OFFICE TODAY  Past Medical History  Diagnosis Date  . Arthritis     Past Surgical History  Procedure Laterality Date  . Knee surgery Left   . Wrist surgery    . Finger surgery    . I&d extremity Left 01/29/2015    Procedure: IRRIGATION AND DEBRIDEMENT  INDEX FINGER;  Surgeon: Toni Arthurs, MD;  Location: [facility] OR;  Service: Orthopedics;  Laterality: Left;  . I&d extremity Left 02/02/2015    Procedure: LEFT INDEX FINGER INCISION AND DRAINAGE AND DEBRIDEMENT;  Surgeon: Bradly Bienenstock, MD;  Location: [facility] OR;  Service: Orthopedics;  Laterality: Left;  . I&d extremity Left 02/04/2015    Procedure: IRRIGATION AND DEBRIDEMENT LEFT INDEX FINGER;  Surgeon: Bradly Bienenstock, MD;  Location: [facility];  Service: Orthopedics;  Laterality: Left;    History reviewed. No pertinent family history. Social History:  reports that he has been smoking.  He does not have any smokeless tobacco history on file. He reports that he does not drink alcohol or use illicit drugs.  Allergies: No Known Allergies  Medications Prior to Admission  Medication Sig Dispense Refill  . clindamycin (CLEOCIN) 300 MG capsule Take 1 capsule (300 mg total) by mouth 3 (three) times daily. 31 capsule 0  . oxyCODONE-acetaminophen (ROXICET) 5-325 MG tablet Take 1 tablet by mouth every 4 (four) hours as needed for severe pain. 30 tablet 0    No results found for this or any previous visit (from the past 48 hour(s)). No results found.  ROSNO RECENT ILLNESSES OR HOSPITALIZATIONS  Blood pressure 121/81, pulse 63, temperature 98.9 F (37.2 C), temperature source Oral, resp. rate 16, height  (1.651 m), weight 72.576 kg (160 lb), SpO2 100 %. Physical Exam  General Appearance:  Alert, cooperative, no distress, appears stated age  Head:  Normocephalic, without obvious  abnormality, atraumatic  Eyes:  Pupils equal, conjunctiva/corneas clear,         Throat: Lips, mucosa, and tongue normal; teeth and gums normal  Neck: No visible masses     Lungs:   respirations unlabored  Chest Wall:  No tenderness or deformity  Heart:  Regular rate and rhythm,  Abdomen:   Soft, non-tender,         Extremities: LEFT INDEX FINGER: DRAINING WOUND DORSALLY HAND LOOKS A LOT BETTER FINGERS WARM WELL PERFUSED FINGER STILL LOOKS BAD, WITH PURULENT DRAINAGE PRESENT DORSALLY  Pulses: 2+ and symmetric  Skin: Skin color, texture, turgor normal, no rashes or lesions     Neurologic: Normal   Assessment/Plan LEFT INDEX FINGER DORSAL INFECTION  LEFT INDEX FINGER REPEAT INCISION AND DRAINAGE AND DEBRIDEMENT AS INDICATED  R/B/A DISCUSSED WITH PT IN OFFICE.  PT VOICED UNDERSTANDING OF PLAN CONSENT SIGNED DAY OF SURGERY PT SEEN AND EXAMINED PRIOR TO OPERATIVE PROCEDURE/DAY OF SURGERY SITE MARKED. QUESTIONS ANSWERED WILL GO HOME FOLLOWING SURGERY  WE ARE PLANNING SURGERY FOR YOUR UPPER EXTREMITY. THE RISKS AND BENEFITS OF SURGERY INCLUDE BUT NOT LIMITED TO BLEEDING INFECTION, DAMAGE TO NEARBY NERVES ARTERIES TENDONS, FAILURE OF SURGERY TO ACCOMPLISH ITS INTENDED GOALS, PERSISTENT SYMPTOMS AND NEED FOR FURTHER SURGICAL INTERVENTION. WITH THIS IN MIND WE WILL PROCEED. I HAVE DISCUSSED WITH THE PATIENT THE PRE AND POSTOPERATIVE REGIMEN AND THE DOS AND DON'TS. PT VOICED UNDERSTANDING AND INFORMED CONSENT SIGNED.  Sharma Covert 02/07/2015, 4:54 PM

## 2015-02-08 ENCOUNTER — Encounter (HOSPITAL_COMMUNITY): Payer: Self-pay | Admitting: Orthopedic Surgery

## 2015-02-08 MED FILL — OXYCODONE/APAP 5-325: 5-325 | 7 days supply | Qty: 40 | Fill #0

## 2015-02-08 MED FILL — CLINDAMYCIN HCL 300 MG CAP: 300 | 14 days supply | Qty: 42 | Fill #0

## 2015-02-08 NOTE — Anesthesia Postprocedure Evaluation (Signed)
Anesthesia Post Note  Patient: Timothy Li  Procedure(s) Performed: Procedure(s) (LRB): Repeat IRRIGATION AND DEBRIDEMENT Left Index Finger (Left)  Patient location during evaluation: PACU Anesthesia Type: General Level of consciousness: awake and alert, oriented and patient cooperative Pain management: pain level controlled Vital Signs Assessment: post-procedure vital signs reviewed and stable Respiratory status: spontaneous breathing, nonlabored ventilation and respiratory function stable Cardiovascular status: blood pressure returned to baseline and stable Postop Assessment: no signs of nausea or vomiting Anesthetic complications: no    Last Vitals:  Filed Vitals:   02/07/15 1945 02/07/15 2000  BP: 143/93 142/95  Pulse: 62 57  Temp:    Resp: 10 10    Last Pain:  Filed Vitals:   02/07/15 2046  PainSc: 6                  Johanny Segers,E. Nyala Kirchner

## 2015-02-15 MED FILL — OXYCODONE/APAP 5-325: 5-325 | 5 days supply | Qty: 20 | Fill #0

## 2015-02-16 NOTE — Op Note (Signed)
NAMEMAKEL, MCMANN              ACCOUNT NO.:  [id number]  MEDICAL RECORD NO.:  [id number]  LOCATION:  [facility]                         FACILITY:  [facility]  PHYSICIAN:  Sharma Covert IV, M.D.DATE OF BIRTH:  02/25/1975  DATE OF PROCEDURE:  02/07/2015 DATE OF DISCHARGE:  02/07/2015                              OPERATIVE REPORT   PREOPERATIVE DIAGNOSIS:  Left index finger abscess.  POSTOPERATIVE DIAGNOSIS:  Left index finger abscess.  ATTENDING PHYSICIAN:  Sharma Covert, M.D., who was scrubbed and present for the entire procedure.  ASSISTANT SURGEON:  None.  ANESTHESIA:  General via LMA.  PROCEDURE: 1. Excisional debridement of skin and subcutaneous tissue and extensor     mechanism down the bone, left index finger. 2. Left index finger metacarpal and interphalangeal joint arthrotomy     drainage and exploration.  COMPLICATIONS:  None.  DRAINS:  None.  DISPOSITION:  Extubated, awake and taken to recovery room in good condition.  SURGICAL INDICATIONS:  Timothy Li is a 40 year old gentleman, was taken to the operating room on February 04, 2015.  The patient is scheduled to return back to the operating room for repeat I and D.  Risks, benefits, and alternatives were discussed in detail with the patient and signed informed consent was obtained.  DESCRIPTION OF PROCEDURE:  The patient was properly identified in the preoperative holding area, marked with a permanent marker made on the left index finger to indicate the correct operative site.  The patient was brought back to the operating room, placed supine on the operating table.  General anesthesia was administered.  Preoperative antibiotics were administered.  Time-out was called, correct side was identified, and procedure then begun.  Left upper extremity was then prepped and draped in normal sterile fashion.  Time-out was called, correct side was identified, and procedure then begun.  A longitudinal incision had  been opened up in the abscess area, it was then drained.  The excisional debridement of the skin and subcutaneous tissue and portion of the extensor mechanism was then carried out.  The MP joint and PIP joint were opened and joint arthrotomy was then carried out.  The wound was then thoroughly irrigated.  After thorough wound irrigation, the finger appeared to be a bit better on today's repeat I and D.  All necrotic visual tissue was then carefully removed.  The wound was irrigated and loosely reapproximated with sutures.  Adaptic dressing and sterile compressive bandage applied.  The patient tolerated the procedure well. Placed in a well-padded volar splint.  Extubated and taken to the recovery room in good condition.  POSTPROCEDURE PLAN:  The patient discharged to home, seen back in the office in approximately 2 days for repeat wound check.  I will continue to follow this closely.  The patient has a very devastating injury to the finger that may require limb amputation in terms of watching closely on oral antibiotics and repeat I and D.  we will follow him closely in the office.     [name], M.D.     FWO/MEDQ  D:  02/15/2015  T:  02/15/2015  Job:  [id number]

## 2015-02-18 MED FILL — CLINDAMYCIN HCL 300 MG CAP: 300 | 10 days supply | Qty: 30 | Fill #0

## 2015-02-22 MED FILL — OXYCODONE/APAP 5-325: 5-325 | 10 days supply | Qty: 20 | Fill #0

## 2015-03-03 MED FILL — CLINDAMYCIN HCL 300 MG CAP: 300 | 10 days supply | Qty: 30 | Fill #0

## 2015-03-03 MED FILL — OXYCODONE/APAP 5-325: 5-325 | 10 days supply | Qty: 20 | Fill #0

## 2015-03-15 LAB — AFB CULTURE WITH SMEAR (NOT AT ARMC): Acid Fast Smear: NONE SEEN

## 2015-03-17 MED FILL — traMADol HCL 50 MG TABS: 50 | 30 days supply | Qty: 60 | Fill #3

## 2015-03-17 MED FILL — MELOXICAM 15 MG TABLET: 15 | 30 days supply | Qty: 30 | Fill #3

## 2015-03-17 MED FILL — CLINDAMYCIN HCL 300 MG CAP: 300 | 10 days supply | Qty: 30 | Fill #0

## 2015-04-01 MED FILL — CLINDAMYCIN HCL 300 MG CAP: 300 | 10 days supply | Qty: 30 | Fill #0

## 2015-04-08 MED FILL — CLINDAMYCIN HCL 300 MG CAP: 300 | 10 days supply | Qty: 30 | Fill #0

## 2015-04-25 MED FILL — CLINDAMYCIN HCL 300 MG CAP: 300 | 10 days supply | Qty: 30 | Fill #0

## 2016-11-29 ENCOUNTER — Ambulatory Visit (INDEPENDENT_AMBULATORY_CARE_PROVIDER_SITE_OTHER): Payer: Managed Care, Other (non HMO) | Admitting: Urgent Care

## 2016-11-29 ENCOUNTER — Encounter: Payer: Self-pay | Admitting: Urgent Care

## 2016-11-29 ENCOUNTER — Other Ambulatory Visit: Payer: Self-pay

## 2016-11-29 VITALS — BP 100/70 | HR 56 | Temp 98.2°F | Resp 16 | Ht 67.0 in | Wt 179.0 lb

## 2016-11-29 DIAGNOSIS — Z23 Encounter for immunization: Secondary | ICD-10-CM | POA: Diagnosis not present

## 2016-11-29 DIAGNOSIS — F17200 Nicotine dependence, unspecified, uncomplicated: Secondary | ICD-10-CM

## 2016-11-29 DIAGNOSIS — F17210 Nicotine dependence, cigarettes, uncomplicated: Secondary | ICD-10-CM | POA: Diagnosis not present

## 2016-11-29 DIAGNOSIS — Z96652 Presence of left artificial knee joint: Secondary | ICD-10-CM

## 2016-11-29 DIAGNOSIS — M25562 Pain in left knee: Secondary | ICD-10-CM

## 2016-11-29 DIAGNOSIS — G8929 Other chronic pain: Secondary | ICD-10-CM

## 2016-11-29 DIAGNOSIS — Z0000 Encounter for general adult medical examination without abnormal findings: Secondary | ICD-10-CM

## 2016-11-29 DIAGNOSIS — Z114 Encounter for screening for human immunodeficiency virus [HIV]: Secondary | ICD-10-CM

## 2016-11-29 MED ORDER — TRAMADOL-ACETAMINOPHEN 37.5-325 MG PO TABS: 1.0000 | ORAL_TABLET | Freq: Three times a day (TID) | ORAL | 0 refills | Status: DC | PRN

## 2016-11-29 MED ORDER — MELOXICAM 7.5 MG PO TABS: 7.5000 mg | ORAL_TABLET | Freq: Every day | ORAL | 1 refills | Status: DC

## 2016-11-29 NOTE — Progress Notes (Signed)
MRN: [id number] DOB: 01/19/1975  Subjective:   Timothy Li is a 41 y.o. male presenting for annual physical. Patient is single, works as a Chartered certified accountantmachinist. Has good relationships at home, has a good support network. Smokes 1ppd, is thinking about quitting. Denies alcohol use.   PCP: None. Visual: No visual deficits. Dental: Cleanings every 6 months. Specialists: None.  Knee pain - Reports longstanding history of left knee pain s/p knee surgery. States that he had knee replacement. He no longer sees an orthopedist. Works as a Chartered certified accountantmachinist and has had severe knee pain recently. Would like a refill of meloxicam. Used to take Tramadol as well.  Timothy Li has a current medication list which includes the following prescription(s): oxycodone-acetaminophen. Also has No Known Allergies.  Timothy Li  has a past medical history of Allergy, Arthritis, and Asthma. Also  has a past surgical history that includes Knee surgery (Left); Wrist surgery; Finger surgery; I&D extremity (Left, 01/29/2015); I&D extremity (Left, 02/02/2015); I&D extremity (Left, 02/04/2015); and I&D extremity (Left, 02/07/2015).  Review of Systems  Constitutional: Negative for chills, diaphoresis, fever, malaise/fatigue and weight loss.  HENT: Negative for congestion, ear discharge, ear pain, hearing loss, nosebleeds, sore throat and tinnitus.   Eyes: Negative for blurred vision, double vision, photophobia, pain, discharge and redness.  Respiratory: Negative for cough, shortness of breath and wheezing.   Cardiovascular: Negative for chest pain, palpitations and leg swelling.  Gastrointestinal: Negative for abdominal pain, blood in stool, constipation, diarrhea, nausea and vomiting.  Genitourinary: Negative for dysuria, flank pain, frequency, hematuria and urgency.  Musculoskeletal: Positive for joint pain (left knee pain, chronic). Negative for back pain and myalgias.  Skin: Negative for itching and rash.  Neurological: Negative for dizziness,  tingling, seizures, loss of consciousness, weakness and headaches.  Endo/Heme/Allergies: Negative for polydipsia.  Psychiatric/Behavioral: Negative for depression, hallucinations, memory loss, substance abuse and suicidal ideas. The patient is not nervous/anxious and does not have insomnia.    Immunizations - Flu shot updated today. Tdap 01/26/2015.  Objective:   Vitals: BP 100/70   Pulse (!) 56   Temp 98.2 F (36.8 C) (Oral)   Resp 16   Ht 5\' 7"  (1.702 m)   Wt 179 lb (81.2 kg)   SpO2 97%   BMI 28.04 kg/m    Visual Acuity Screening   Right eye Left eye Both eyes  Without correction: 20/20 20/20 20/20   With correction:      Physical Exam  Constitutional: He is oriented to person, place, and time. He appears well-developed and well-nourished.  HENT:  TM's intact bilaterally, no effusions or erythema. Nasal turbinates pink and moist, nasal passages patent. No sinus tenderness. Oropharynx clear, mucous membranes moist, dentition in good repair.  Eyes: Conjunctivae and EOM are normal. Pupils are equal, round, and reactive to light. Right eye exhibits no discharge. Left eye exhibits no discharge. No scleral icterus.  Neck: Normal range of motion. Neck supple. No thyromegaly present.  Cardiovascular: Normal rate, regular rhythm and intact distal pulses. Exam reveals no gallop and no friction rub.  No murmur heard. Pulmonary/Chest: No stridor. No respiratory distress. He has no wheezes. He has no rales.  Abdominal: Soft. Bowel sounds are normal. He exhibits no distension and no mass. There is no tenderness.  Musculoskeletal: Normal range of motion. He exhibits no edema or tenderness.  Lymphadenopathy:    He has no cervical adenopathy.  Neurological: He is alert and oriented to person, place, and time. He has normal reflexes. He displays normal reflexes. Coordination normal.  Skin: Skin is warm and dry. No rash noted. No erythema. No pallor.  Psychiatric: He has a normal mood and  affect.   Assessment and Plan :   1. Annual physical exam - Medically stable, labs pending. Discussed healthy lifestyle, diet, exercise, preventative care, vaccinations, and addressed patient's concerns.  - Comprehensive metabolic panel - TSH - Lipid panel - CBC with Differential/Platelet  2. Encounter for screening for HIV - HIV antibody  3. Chronic pain of left knee 4. History of left knee replacement - Will refer to ortho if knee pain persists. Admits that he used to receive consistent knee injections to help with his pain. Will use meloxicam and Ultracet in the meantime.   5. Flu vaccine need - Flu Vaccine QUAD 36+ mos IM   6. Tobacco use disorder - Counseled patient at length about smoking cessation.  Wallis BambergMario Deidrea Gaetz, PA-C [facility] [phone] 11/29/2016  11:46 AM

## 2016-11-29 NOTE — Patient Instructions (Addendum)
Health Maintenance, Male A healthy lifestyle and preventive care is important for your health and wellness. Ask your health care provider about what schedule of regular examinations is right for you. What should I know about weight and diet? Eat a Healthy Diet  Eat plenty of vegetables, fruits, whole grains, low-fat dairy products, and lean protein.  Do not eat a lot of foods high in solid fats, added sugars, or salt.  Maintain a Healthy Weight Regular exercise can help you achieve or maintain a healthy weight. You should:  Do at least 150 minutes of exercise each week. The exercise should increase your heart rate and make you sweat (moderate-intensity exercise).  Do strength-training exercises at least twice a week.  Watch Your Levels of Cholesterol and Blood Lipids  Have your blood tested for lipids and cholesterol every 5 years starting at 40 years of age. If you are at high risk for heart disease, you should start having your blood tested when you are 40 years old. You may need to have your cholesterol levels checked more often if: ? Your lipid or cholesterol levels are high. ? You are older than 40 years of age. ? You are at high risk for heart disease.  What should I know about cancer screening? Many types of cancers can be detected early and may often be prevented. Lung Cancer  You should be screened every year for lung cancer if: ? You are a current smoker who has smoked for at least 30 years. ? You are a former smoker who has quit within the past 15 years.  Talk to your health care provider about your screening options, when you should start screening, and how often you should be screened.  Colorectal Cancer  Routine colorectal cancer screening usually begins at 40 years of age and should be repeated every 5-10 years until you are 40 years old. You may need to be screened more often if early forms of precancerous polyps or small growths are found. Your health care provider  may recommend screening at an earlier age if you have risk factors for colon cancer.  Your health care provider may recommend using home test kits to check for hidden blood in the stool.  A small camera at the end of a tube can be used to examine your colon (sigmoidoscopy or colonoscopy). This checks for the earliest forms of colorectal cancer.  Prostate and Testicular Cancer  Depending on your age and overall health, your health care provider may do certain tests to screen for prostate and testicular cancer.  Talk to your health care provider about any symptoms or concerns you have about testicular or prostate cancer.  Skin Cancer  Check your skin from head to toe regularly.  Tell your health care provider about any new moles or changes in moles, especially if: ? There is a change in a mole's size, shape, or color. ? You have a mole that is larger than a pencil eraser.  Always use sunscreen. Apply sunscreen liberally and repeat throughout the day.  Protect yourself by wearing long sleeves, pants, a wide-brimmed hat, and sunglasses when outside.  What should I know about heart disease, diabetes, and high blood pressure?  If you are 18-39 years of age, have your blood pressure checked every 3-5 years. If you are 40 years of age or older, have your blood pressure checked every year. You should have your blood pressure measured twice-once when you are at a hospital or clinic, and once when   you are not at a hospital or clinic. Record the average of the two measurements. To check your blood pressure when you are not at a hospital or clinic, you can use: ? An automated blood pressure machine at a pharmacy. ? A home blood pressure monitor.  Talk to your health care provider about your target blood pressure.  If you are between 67-38 years old, ask your health care provider if you should take aspirin to prevent heart disease.  Have regular diabetes screenings by checking your fasting blood  sugar level. ? If you are at a normal weight and have a low risk for diabetes, have this test once every three years after the age of 23. ? If you are overweight and have a high risk for diabetes, consider being tested at a younger age or more often.  A one-time screening for abdominal aortic aneurysm (AAA) by ultrasound is recommended for men aged 65-75 years who are current or former smokers. What should I know about preventing infection? Hepatitis B If you have a higher risk for hepatitis B, you should be screened for this virus. Talk with your health care provider to find out if you are at risk for hepatitis B infection. Hepatitis C Blood testing is recommended for:  Everyone born from [birth year] through 1964.  Anyone with known risk factors for hepatitis C.  Sexually Transmitted Diseases (STDs)  You should be screened each year for STDs including gonorrhea and chlamydia if: ? You are sexually active and are younger than 40 years of age. ? You are older than 40 years of age and your health care provider tells you that you are at risk for this type of infection. ? Your sexual activity has changed since you were last screened and you are at an increased risk for chlamydia or gonorrhea. Ask your health care provider if you are at risk.  Talk with your health care provider about whether you are at high risk of being infected with HIV. Your health care provider may recommend a prescription medicine to help prevent HIV infection.  What else can I do?  Schedule regular health, dental, and eye exams.  Stay current with your vaccines (immunizations).  Do not use any tobacco products, such as cigarettes, chewing tobacco, and e-cigarettes. If you need help quitting, ask your health care provider.  Limit alcohol intake to no more than 2 drinks per day. One drink equals 12 ounces of beer, 5 ounces of wine, or 1 ounces of hard liquor.  Do not use street drugs.  Do not share needles.  Ask your  health care provider for help if you need support or information about quitting drugs.  Tell your health care provider if you often feel depressed.  Tell your health care provider if you have ever been abused or do not feel safe at home. This information is not intended to replace advice given to you by your health care provider. Make sure you discuss any questions you have with your health care provider. Document Released: 06/30/2007 Document Revised: 08/31/2015 Document Reviewed: 10/05/2014 Elsevier Interactive Patient Education  2018 [employer].     Steps to Quit Smoking Smoking tobacco can be harmful to your health and can affect almost every organ in your body. Smoking puts you, and those around you, at risk for developing many serious chronic diseases. Quitting smoking is difficult, but it is one of the best things that you can do for your health. It is never too late to  quit. What are the benefits of quitting smoking? When you quit smoking, you lower your risk of developing serious diseases and conditions, such as:  Lung cancer or lung disease, such as COPD.  Heart disease.  Stroke.  Heart attack.  Infertility.  Osteoporosis and bone fractures.  Additionally, symptoms such as coughing, wheezing, and shortness of breath may get better when you quit. You may also find that you get sick less often because your body is stronger at fighting off colds and infections. If you are pregnant, quitting smoking can help to reduce your chances of having a baby of low birth weight. How do I get ready to quit? When you decide to quit smoking, create a plan to make sure that you are successful. Before you quit:  Pick a date to quit. Set a date within the next two weeks to give you time to prepare.  Write down the reasons why you are quitting. Keep this list in places where you will see it often, such as on your bathroom mirror or in your car or wallet.  Identify the people, places,  things, and activities that make you want to smoke (triggers) and avoid them. Make sure to take these actions: ? Throw away all cigarettes at home, at work, and in your car. ? Throw away smoking accessories, such as Set designer. ? Clean your car and make sure to empty the ashtray. ? Clean your home, including curtains and carpets.  Tell your family, friends, and coworkers that you are quitting. Support from your loved ones can make quitting easier.  Talk with your health care provider about your options for quitting smoking.  Find out what treatment options are covered by your health insurance.  What strategies can I use to quit smoking? Talk with your healthcare provider about different strategies to quit smoking. Some strategies include:  Quitting smoking altogether instead of gradually lessening how much you smoke over a period of time. Research shows that quitting "cold [place]" is more successful than gradually quitting.  Attending in-person counseling to help you build problem-solving skills. You are more likely to have success in quitting if you attend several counseling sessions. Even short sessions of 10 minutes can be effective.  Finding resources and support systems that can help you to quit smoking and remain smoke-free after you quit. These resources are most helpful when you use them often. They can include: ? Online chats with a Veterinary surgeon. ? Telephone quitlines. ? Automotive engineer. ? Support groups or group counseling. ? Text messaging programs. ? Mobile phone applications.  Taking medicines to help you quit smoking. (If you are pregnant or breastfeeding, talk with your health care provider first.) Some medicines contain nicotine and some do not. Both types of medicines help with cravings, but the medicines that include nicotine help to relieve withdrawal symptoms. Your health care provider may recommend: ? Nicotine patches, gum, or lozenges. ? Nicotine  inhalers or sprays. ? Non-nicotine medicine that is taken by mouth.  Talk with your health care provider about combining strategies, such as taking medicines while you are also receiving in-person counseling. Using these two strategies together makes you more likely to succeed in quitting than if you used either strategy on its own. If you are pregnant or breastfeeding, talk with your health care provider about finding counseling or other support strategies to quit smoking. Do not take medicine to help you quit smoking unless told to do so by your health care provider. What things  can I do to make it easier to quit? Quitting smoking might feel overwhelming at first, but there is a lot that you can do to make it easier. Take these important actions:  Reach out to your family and friends and ask that they support and encourage you during this time. Call telephone quitlines, reach out to support groups, or work with a counselor for support.  Ask people who smoke to avoid smoking around you.  Avoid places that trigger you to smoke, such as bars, parties, or smoke-break areas at work.  Spend time around people who do not smoke.  Lessen stress in your life, because stress can be a smoking trigger for some people. To lessen stress, try: ? Exercising regularly. ? Deep-breathing exercises. ? Yoga. ? Meditating. ? Performing a body scan. This involves closing your eyes, scanning your body from head to toe, and noticing which parts of your body are particularly tense. Purposefully relax the muscles in those areas.  Download or purchase mobile phone or tablet apps (applications) that can help you stick to your quit plan by providing reminders, tips, and encouragement. There are many free apps, such as QuitGuide from the [employer] developer(Centers for Disease Control and Prevention). You can find other support for quitting smoking (smoking cessation) through smokefree.gov and other websites.  How will I feel when I  quit smoking? Within the first 24 hours of quitting smoking, you may start to feel some withdrawal symptoms. These symptoms are usually most noticeable 2-3 days after quitting, but they usually do not last beyond 2-3 weeks. Changes or symptoms that you might experience include:  Mood swings.  Restlessness, anxiety, or irritation.  Difficulty concentrating.  Dizziness.  Strong cravings for sugary foods in addition to nicotine.  Mild weight gain.  Constipation.  Nausea.  Coughing or a sore throat.  Changes in how your medicines work in your body.  A depressed mood.  Difficulty sleeping (insomnia).  After the first 2-3 weeks of quitting, you may start to notice more positive results, such as:  Improved sense of smell and taste.  Decreased coughing and sore throat.  Slower heart rate.  Lower blood pressure.  Clearer skin.  The ability to breathe more easily.  Fewer sick days.  Quitting smoking is very challenging for most people. Do not get discouraged if you are not successful the first time. Some people need to make many attempts to quit before they achieve long-term success. Do your best to stick to your quit plan, and talk with your health care provider if you have any questions or concerns. This information is not intended to replace advice given to you by your health care provider. Make sure you discuss any questions you have with your health care provider. Document Released: 12/26/2000 Document Revised: 08/30/2015 Document Reviewed: 05/18/2014 Elsevier Interactive Patient Education  2017 Elsevier Inc.      Bupropion sustained-release tablets (smoking cessation) What is this medicine? BUPROPION (byoo PROE pee on) is used to help people quit smoking. This medicine may be used for other purposes; ask your health care provider or pharmacist if you have questions. COMMON BRAND NAME(S): Buproban, Zyban What should I tell my health care provider before I take  this medicine? They need to know if you have any of these conditions: -an eating disorder, such as anorexia or bulimia -bipolar disorder or psychosis -diabetes or high blood sugar, treated with medication -glaucoma -head injury or brain tumor -heart disease, previous heart attack, or irregular heart beat -high  blood pressure -kidney or liver disease -seizures -suicidal thoughts or a previous suicide attempt -Tourette's syndrome -weight loss -an unusual or allergic reaction to bupropion, other medicines, foods, dyes, or preservatives -breast-feeding -pregnant or trying to become pregnant How should I use this medicine? Take this medicine by mouth with a glass of water. Follow the directions on the prescription label. You can take it with or without food. If it upsets your stomach, take it with food. Do not cut, crush or chew this medicine. Take your medicine at regular intervals. If you take this medicine more than once a day, take your second dose at least 8 hours after you take your first dose. To limit difficulty in sleeping, avoid taking this medicine at bedtime. Do not take your medicine more often than directed. Do not stop taking this medicine suddenly except upon the advice of your doctor. Stopping this medicine too quickly may cause serious side effects. A special MedGuide will be given to you by the pharmacist with each prescription and refill. Be sure to read this information carefully each time. Talk to your pediatrician regarding the use of this medicine in children. Special care may be needed. Overdosage: If you think you have taken too much of this medicine contact a poison control center or emergency room at once. NOTE: This medicine is only for you. Do not share this medicine with others. What if I miss a dose? If you miss a dose, skip the missed dose and take your next tablet at the regular time. There should be at least 8 hours between doses. Do not take double or extra  doses. What may interact with this medicine? Do not take this medicine with any of the following medications: -linezolid -MAOIs like Azilect, Carbex, Eldepryl, Marplan, Nardil, and Parnate -methylene blue (injected into a vein) -other medicines that contain bupropion like Wellbutrin This medicine may also interact with the following medications: -alcohol -certain medicines for anxiety or sleep -certain medicines for blood pressure like metoprolol, propranolol -certain medicines for depression or psychotic disturbances -certain medicines for HIV or AIDS like efavirenz, lopinavir, nelfinavir, ritonavir -certain medicines for irregular heart beat like propafenone, flecainide -certain medicines for Parkinson's disease like amantadine, levodopa -certain medicines for seizures like carbamazepine, phenytoin, phenobarbital -cimetidine -clopidogrel -cyclophosphamide -digoxin -furazolidone -isoniazid -nicotine -orphenadrine -procarbazine -steroid medicines like prednisone or cortisone -stimulant medicines for attention disorders, weight loss, or to stay awake -tamoxifen -theophylline -thiotepa -ticlopidine -tramadol -warfarin This list may not describe all possible interactions. Give your health care provider a list of all the medicines, herbs, non-prescription drugs, or dietary supplements you use. Also tell them if you smoke, drink alcohol, or use illegal drugs. Some items may interact with your medicine. What should I watch for while using this medicine? Visit your doctor or health care professional for regular checks on your progress. This medicine should be used together with a patient support program. It is important to participate in a behavioral program, counseling, or other support program that is recommended by your health care professional. Patients and their families should watch out for new or worsening thoughts of suicide or depression. Also watch out for sudden changes in  feelings such as feeling anxious, agitated, panicky, irritable, hostile, aggressive, impulsive, severely restless, overly excited and hyperactive, or not being able to sleep. If this happens, especially at the beginning of treatment or after a change in dose, call your health care professional. Avoid alcoholic drinks while taking this medicine. Drinking excessive alcoholic beverages, using sleeping or  anxiety medicines, or quickly stopping the use of these agents while taking this medicine may increase your risk for a seizure. Do not drive or use heavy machinery until you know how this medicine affects you. This medicine can impair your ability to perform these tasks. Do not take this medicine close to bedtime. It may prevent you from sleeping. Your mouth may get dry. Chewing sugarless gum or sucking hard candy, and drinking plenty of water may help. Contact your doctor if the problem does not go away or is severe. Do not use nicotine patches or chewing gum without the advice of your doctor or health care professional while taking this medicine. You may need to have your blood pressure taken regularly if your doctor recommends that you use both nicotine and this medicine together. What side effects may I notice from receiving this medicine? Side effects that you should report to your doctor or health care professional as soon as possible: -allergic reactions like skin rash, itching or hives, swelling of the face, lips, or tongue -breathing problems -changes in vision -confusion -elevated mood, decreased need for sleep, racing thoughts, impulsive behavior -fast or irregular heartbeat -hallucinations, loss of contact with reality -increased blood pressure -redness, blistering, peeling or loosening of the skin, including inside the mouth -seizures -suicidal thoughts or other mood changes -unusually weak or tired -vomiting Side effects that usually do not require medical attention (report to your  doctor or health care professional if they continue or are bothersome): -constipation -headache -loss of appetite -nausea -tremors -weight loss This list may not describe all possible side effects. Call your doctor for medical advice about side effects. You may report side effects to FDA at 1-800-FDA-1088. Where should I keep my medicine? Keep out of the reach of children. Store at room temperature between 20 and 25 degrees C (68 and 77 degrees F). Protect from light. Keep container tightly closed. Throw away any unused medicine after the expiration date. NOTE: This sheet is a summary. It may not cover all possible information. If you have questions about this medicine, talk to your doctor, pharmacist, or health care provider.  2018 Elsevier/Gold Standard (2015-06-24 13:49:28)    Varenicline oral tablets What is this medicine? VARENICLINE (var EN i kleen) is used to help people quit smoking. It can reduce the symptoms caused by stopping smoking. It is used with a patient support program recommended by your physician. This medicine may be used for other purposes; ask your health care provider or pharmacist if you have questions. COMMON BRAND NAME(S): Chantix What should I tell my health care provider before I take this medicine? They need to know if you have any of these conditions: -bipolar disorder, depression, schizophrenia or other mental illness -heart disease -if you often drink alcohol -kidney disease -peripheral vascular disease -seizures -stroke -suicidal thoughts, plans, or attempt; a previous suicide attempt by you or a family member -an unusual or allergic reaction to varenicline, other medicines, foods, dyes, or preservatives -pregnant or trying to get pregnant -breast-feeding How should I use this medicine? Take this medicine by mouth after eating. Take with a full glass of water. Follow the directions on the prescription label. Take your doses at regular intervals.  Do not take your medicine more often than directed. There are 3 ways you can use this medicine to help you quit smoking; talk to your health care professional to decide which plan is right for you: 1) you can choose a quit date and start this medicine  1 week before the quit date, or, 2) you can start taking this medicine before you choose a quit date, and then pick a quit date between day 8 and 35 days of treatment, or, 3) if you are not sure that you are able or willing to quit smoking right away, start taking this medicine and slowly decrease the amount you smoke as directed by your health care professional with the goal of being cigarette-free by week 12 of treatment. Stick to your plan; ask about support groups or other ways to help you remain cigarette-free. If you are motivated to quit smoking and did not succeed during a previous attempt with this medicine for reasons other than side effects, or if you returned to smoking after this treatment, speak with your health care professional about whether another course of this medicine may be right for you. A special MedGuide will be given to you by the pharmacist with each prescription and refill. Be sure to read this information carefully each time. Talk to your pediatrician regarding the use of this medicine in children. This medicine is not approved for use in children. Overdosage: If you think you have taken too much of this medicine contact a poison control center or emergency room at once. NOTE: This medicine is only for you. Do not share this medicine with others. What if I miss a dose? If you miss a dose, take it as soon as you can. If it is almost time for your next dose, take only that dose. Do not take double or extra doses. What may interact with this medicine? -alcohol or any product that contains alcohol -insulin -other stop smoking aids -theophylline -warfarin This list may not describe all possible interactions. Give your health  care provider a list of all the medicines, herbs, non-prescription drugs, or dietary supplements you use. Also tell them if you smoke, drink alcohol, or use illegal drugs. Some items may interact with your medicine. What should I watch for while using this medicine? Visit your doctor or health care professional for regular check ups. Ask for ongoing advice and encouragement from your doctor or healthcare professional, friends, and family to help you quit. If you smoke while on this medication, quit again Your mouth may get dry. Chewing sugarless gum or sucking hard candy, and drinking plenty of water may help. Contact your doctor if the problem does not go away or is severe. You may get drowsy or dizzy. Do not drive, use machinery, or do anything that needs mental alertness until you know how this medicine affects you. Do not stand or sit up quickly, especially if you are an older patient. This reduces the risk of dizzy or fainting spells. Sleepwalking can happen during treatment with this medicine, and can sometimes lead to behavior that is harmful to you, other people, or property. Stop taking this medicine and tell your doctor if you start sleepwalking or have other unusual sleep-related activity. Decrease the amount of alcoholic beverages that you drink during treatment with this medicine until you know if this medicine affects your ability to tolerate alcohol. Some people have experienced increased drunkenness (intoxication), unusual or sometimes aggressive behavior, or no memory of things that have happened (amnesia) during treatment with this medicine. The use of this medicine may increase the chance of suicidal thoughts or actions. Pay special attention to how you are responding while on this medicine. Any worsening of mood, or thoughts of suicide or dying should be reported to your health care  professional right away. What side effects may I notice from receiving this medicine? Side effects that  you should report to your doctor or health care professional as soon as possible: -allergic reactions like skin rash, itching or hives, swelling of the face, lips, tongue, or throat -acting aggressive, being angry or violent, or acting on dangerous impulses -breathing problems -changes in vision -chest pain or chest tightness -confusion, trouble speaking or understanding -new or worsening depression, anxiety, or panic attacks -extreme increase in activity and talking (mania) -fast, irregular heartbeat -feeling faint or lightheaded, falls -fever -pain in legs when walking -problems with balance, talking, walking -redness, blistering, peeling or loosening of the skin, including inside the mouth -ringing in ears -seeing or hearing things that aren't there (hallucinations) -seizures -sleepwalking -sudden numbness or weakness of the face, arm or leg -thoughts about suicide or dying, or attempts to commit suicide -trouble passing urine or change in the amount of urine -unusual bleeding or bruising -unusually weak or tired Side effects that usually do not require medical attention (report to your doctor or health care professional if they continue or are bothersome): -constipation -headache -nausea, vomiting -strange dreams -stomach gas -trouble sleeping This list may not describe all possible side effects. Call your doctor for medical advice about side effects. You may report side effects to FDA at 1-800-FDA-1088. Where should I keep my medicine? Keep out of the reach of children. Store at room temperature between 15 and 30 degrees C (59 and 86 degrees F). Throw away any unused medicine after the expiration date. NOTE: This sheet is a summary. It may not cover all possible information. If you have questions about this medicine, talk to your doctor, pharmacist, or health care provider.  2018 Elsevier/Gold Standard (2014-09-16 16:14:23)     Influenza (Flu) Vaccine (Inactivated or  Recombinant): What You Need to Know 1. Why get vaccinated? Influenza ("flu") is a contagious disease that spreads around the [place] every year, usually between October and May. Flu is caused by influenza viruses, and is spread mainly by coughing, sneezing, and close contact. Anyone can get flu. Flu strikes suddenly and can last several days. Symptoms vary by age, but can include:  fever/chills  sore throat  muscle aches  fatigue  cough  headache  runny or stuffy nose  Flu can also lead to pneumonia and blood infections, and cause diarrhea and seizures in children. If you have a medical condition, such as heart or lung disease, flu can make it worse. Flu is more dangerous for some people. Infants and young children, people 40 years of age and older, pregnant women, and people with certain health conditions or a weakened immune system are at greatest risk. Each year thousands of people in the [place] die from flu, and many more are hospitalized. Flu vaccine can:  keep you from getting flu,  make flu less severe if you do get it, and  keep you from spreading flu to your family and other people. 2. Inactivated and recombinant flu vaccines A dose of flu vaccine is recommended every flu season. Children 6 months through 428 years of age may need two doses during the same flu season. Everyone else needs only one dose each flu season. Some inactivated flu vaccines contain a very small amount of a mercury-based preservative called thimerosal. Studies have not shown thimerosal in vaccines to be harmful, but flu vaccines that do not contain thimerosal are available. There is no live flu virus in flu shots. They  cannot cause the flu. There are many flu viruses, and they are always changing. Each year a new flu vaccine is made to protect against three or four viruses that are likely to cause disease in the upcoming flu season. But even when the vaccine doesn't exactly match these  viruses, it may still provide some protection. Flu vaccine cannot prevent:  flu that is caused by a virus not covered by the vaccine, or  illnesses that look like flu but are not.  It takes about 2 weeks for protection to develop after vaccination, and protection lasts through the flu season. 3. Some people should not get this vaccine Tell the person who is giving you the vaccine:  If you have any severe, life-threatening allergies. If you ever had a life-threatening allergic reaction after a dose of flu vaccine, or have a severe allergy to any part of this vaccine, you may be advised not to get vaccinated. Most, but not all, types of flu vaccine contain a small amount of egg protein.  If you ever had Guillain-Barr Syndrome (also called GBS). Some people with a history of GBS should not get this vaccine. This should be discussed with your doctor.  If you are not feeling well. It is usually okay to get flu vaccine when you have a mild illness, but you might be asked to come back when you feel better.  4. Risks of a vaccine reaction With any medicine, including vaccines, there is a chance of reactions. These are usually mild and go away on their own, but serious reactions are also possible. Most people who get a flu shot do not have any problems with it. Minor problems following a flu shot include:  soreness, redness, or swelling where the shot was given  hoarseness  sore, red or itchy eyes  cough  fever  aches  headache  itching  fatigue  If these problems occur, they usually begin soon after the shot and last 1 or 2 days. More serious problems following a flu shot can include the following:  There may be a small increased risk of Guillain-Barre Syndrome (GBS) after inactivated flu vaccine. This risk has been estimated at 1 or 2 additional cases per million people vaccinated. This is much lower than the risk of severe complications from flu, which can be prevented by flu  vaccine.  Young children who get the flu shot along with pneumococcal vaccine (PCV13) and/or DTaP vaccine at the same time might be slightly more likely to have a seizure caused by fever. Ask your doctor for more information. Tell your doctor if a child who is getting flu vaccine has ever had a seizure.  Problems that could happen after any injected vaccine:  People sometimes faint after a medical procedure, including vaccination. Sitting or lying down for about 15 minutes can help prevent fainting, and injuries caused by a fall. Tell your doctor if you feel dizzy, or have vision changes or ringing in the ears.  Some people get severe pain in the shoulder and have difficulty moving the arm where a shot was given. This happens very rarely.  Any medication can cause a severe allergic reaction. Such reactions from a vaccine are very rare, estimated at about 1 in a million doses, and would happen within a few minutes to a few hours after the vaccination. As with any medicine, there is a very remote chance of a vaccine causing a serious injury or death. The safety of vaccines is always being monitored.  For more information, visit: http://floyd.org/ 5. What if there is a serious reaction? What should I look for? Look for anything that concerns you, such as signs of a severe allergic reaction, very high fever, or unusual behavior. Signs of a severe allergic reaction can include hives, swelling of the face and throat, difficulty breathing, a fast heartbeat, dizziness, and weakness. These would start a few minutes to a few hours after the vaccination. What should I do?  If you think it is a severe allergic reaction or other emergency that can't wait, call 9-1-1 and get the person to the nearest hospital. Otherwise, call your doctor.  Reactions should be reported to the Vaccine Adverse Event Reporting System (VAERS). Your doctor should file this report, or you can do it yourself through the VAERS  web site at www.vaers.LAgents.no, or by calling 1-667-453-2615. ? VAERS does not give medical advice. 6. The National Vaccine Injury Compensation Program The [employer] Vaccine Injury Compensation Program (VICP) is a federal program that was created to compensate people who may have been injured by certain vaccines. Persons who believe they may have been injured by a vaccine can learn about the program and about filing a claim by calling 1-(218)323-5802 or visiting the VICP website at SpiritualWord.at. There is a time limit to file a claim for compensation. 7. How can I learn more?  Ask your healthcare provider. He or she can give you the vaccine package insert or suggest other sources of information.  Call your local or state health department.  Contact the Centers for Disease Control and Prevention (CDC): ? Call (520) 293-0707 (1-800-CDC-INFO) or ? Visit CDC's website at BiotechRoom.com.cy Vaccine Information Statement, Inactivated Influenza Vaccine (08/21/2013) This information is not intended to replace advice given to you by your health care provider. Make sure you discuss any questions you have with your health care provider. Document Released: 10/26/2005 Document Revised: 09/22/2015 Document Reviewed: 09/22/2015 Elsevier Interactive Patient Education  2017 [employer].

## 2016-11-30 LAB — CBC WITH DIFFERENTIAL/PLATELET
BASOS ABS: 0.1 10*3/uL (ref 0.0–0.2)
Basos: 1 %
EOS (ABSOLUTE): 0.2 10*3/uL (ref 0.0–0.4)
Eos: 2 %
Hematocrit: 42 % (ref 37.5–51.0)
Hemoglobin: 13.5 g/dL (ref 13.0–17.7)
IMMATURE GRANS (ABS): 0 10*3/uL (ref 0.0–0.1)
Immature Granulocytes: 0 %
LYMPHS ABS: 2.5 10*3/uL (ref 0.7–3.1)
Lymphs: 24 %
MCH: 30 pg (ref 26.6–33.0)
MCHC: 32.1 g/dL (ref 31.5–35.7)
MCV: 93 fL (ref 79–97)
MONOCYTES: 6 %
Monocytes Absolute: 0.6 10*3/uL (ref 0.1–0.9)
Neutrophils Absolute: 7.1 10*3/uL — ABNORMAL HIGH (ref 1.4–7.0)
Neutrophils: 67 %
PLATELETS: 332 10*3/uL (ref 150–379)
RBC: 4.5 x10E6/uL (ref 4.14–5.80)
RDW: 15.8 % — ABNORMAL HIGH (ref 12.3–15.4)
WBC: 10.4 10*3/uL (ref 3.4–10.8)

## 2016-11-30 LAB — COMPREHENSIVE METABOLIC PANEL
A/G RATIO: 1.8 (ref 1.2–2.2)
ALK PHOS: 72 IU/L (ref 39–117)
ALT: 13 IU/L (ref 0–44)
AST: 16 IU/L (ref 0–40)
Albumin: 4.4 g/dL (ref 3.5–5.5)
BUN/Creatinine Ratio: 12 (ref 9–20)
BUN: 10 mg/dL (ref 6–24)
Bilirubin Total: <0.2 mg/dL (ref 0.0–1.2)
CALCIUM: 9.8 mg/dL (ref 8.7–10.2)
CO2: 25 mmol/L (ref 20–29)
Chloride: 103 mmol/L (ref 96–106)
Creatinine, Ser: 0.83 mg/dL (ref 0.76–1.27)
GFR calc Af Amer: 127 mL/min/{1.73_m2} (ref >59)
GFR, EST NON AFRICAN AMERICAN: 110 mL/min/{1.73_m2} (ref >59)
GLOBULIN, TOTAL: 2.4 g/dL (ref 1.5–4.5)
Glucose: 94 mg/dL (ref 65–99)
POTASSIUM: 4.6 mmol/L (ref 3.5–5.2)
SODIUM: 141 mmol/L (ref 134–144)
Total Protein: 6.8 g/dL (ref 6.0–8.5)

## 2016-11-30 LAB — LIPID PANEL
CHOLESTEROL TOTAL: 170 mg/dL (ref 100–199)
Chol/HDL Ratio: 3.9 ratio (ref 0.0–5.0)
HDL: 44 mg/dL (ref >39)
LDL CALC: 109 mg/dL — AB (ref 0–99)
Triglycerides: 84 mg/dL (ref 0–149)
VLDL Cholesterol Cal: 17 mg/dL (ref 5–40)

## 2016-11-30 LAB — HIV ANTIBODY (ROUTINE TESTING W REFLEX): HIV SCREEN 4TH GENERATION: NONREACTIVE

## 2016-11-30 LAB — TSH: TSH: 0.583 u[IU]/mL (ref 0.450–4.500)

## 2016-12-05 ENCOUNTER — Encounter: Payer: Self-pay | Admitting: Urgent Care

## 2016-12-18 ENCOUNTER — Ambulatory Visit: Payer: Managed Care, Other (non HMO) | Admitting: Urgent Care

## 2016-12-20 ENCOUNTER — Other Ambulatory Visit: Payer: Self-pay

## 2016-12-20 ENCOUNTER — Encounter: Payer: Self-pay | Admitting: Urgent Care

## 2016-12-20 ENCOUNTER — Ambulatory Visit (INDEPENDENT_AMBULATORY_CARE_PROVIDER_SITE_OTHER): Payer: Managed Care, Other (non HMO) | Admitting: Urgent Care

## 2016-12-20 VITALS — BP 106/69 | HR 57 | Temp 99.0°F | Resp 16 | Ht 67.0 in | Wt 176.4 lb

## 2016-12-20 DIAGNOSIS — K219 Gastro-esophageal reflux disease without esophagitis: Secondary | ICD-10-CM

## 2016-12-20 DIAGNOSIS — R1013 Epigastric pain: Secondary | ICD-10-CM | POA: Diagnosis not present

## 2016-12-20 DIAGNOSIS — G8929 Other chronic pain: Secondary | ICD-10-CM

## 2016-12-20 DIAGNOSIS — L700 Acne vulgaris: Secondary | ICD-10-CM

## 2016-12-20 MED ORDER — HYDROXYZINE PAMOATE 50 MG PO CAPS: 50.0000 mg | ORAL_CAPSULE | Freq: Three times a day (TID) | ORAL | 1 refills | Status: AC | PRN

## 2016-12-20 MED ORDER — OMEPRAZOLE 20 MG PO CPDR: 20.0000 mg | DELAYED_RELEASE_CAPSULE | Freq: Two times a day (BID) | ORAL | 3 refills | Status: AC

## 2016-12-20 MED ORDER — CLINDAMYCIN PHOS-BENZOYL PEROX 1.2-5 % EX GEL: 1.0000 "application " | Freq: Two times a day (BID) | CUTANEOUS | 0 refills | Status: AC

## 2016-12-20 MED ORDER — DOXYCYCLINE HYCLATE 100 MG PO TABS: 100.0000 mg | ORAL_TABLET | Freq: Every day | ORAL | 1 refills | Status: AC

## 2016-12-20 NOTE — Progress Notes (Signed)
    MRN: [id number] DOB: 07/31/1975  Subjective:   Timothy Li is a 41 y.o. male presenting for chronic rash over his back. States that he has used acne cream, anti-itch creams in the past with some relief. Lesions come to a head and pop. Denies fever, pain, redness, swelling. Also has longstanding history of epigastric, midsternal pain associated with eating acidic foods like pizza, ketchup, spicy foods or drinking sweet tea. Pain worsens when he lays down. Denies sore throat, cough, chest pain, shob, n/v. Has tried Tums, ranitidine with minimal relief.  Rella Larvemmanuel has a current medication list which includes the following prescription(s): meloxicam and tramadol-acetaminophen. Also has No Known Allergies.  Rella Larvemmanuel  has a past medical history of Allergy, Arthritis, and Asthma. Also  has a past surgical history that includes Knee surgery (Left); Wrist surgery; Finger surgery; I&D extremity (Left, 01/29/2015); I&D extremity (Left, 02/02/2015); I&D extremity (Left, 02/04/2015); and I&D extremity (Left, 02/07/2015).  Objective:   Vitals: BP 106/69 (BP Location: Left Arm, Patient Position: Sitting, Cuff Size: Large)   Pulse (!) 57   Temp 99 F (37.2 C) (Oral)   Resp 16   Ht 5\' 7"  (1.702 m)   Wt 176 lb 6.4 oz (80 kg)   SpO2 98%   BMI 27.63 kg/m   Physical Exam  Constitutional: He is oriented to person, place, and time. He appears well-developed and well-nourished.  Cardiovascular: Normal rate.  Pulmonary/Chest: Effort normal.  Abdominal: Soft. Bowel sounds are normal. He exhibits no distension and no mass. There is no tenderness. There is no guarding.  Neurological: He is alert and oriented to person, place, and time.  Skin: Skin is warm and dry. Rash (multiple solitary lesions and excoriations as depicted) noted.        Assessment and Plan :   1. Abdominal pain, chronic, epigastric 2. Gastroesophageal reflux disease without esophagitis - Start omeprazole, labs pending. Counseled on  dietary modifications. Will follow up with lab results. - H. pylori breath test  3. Acne vulgaris - Will manage with doxycycline, patient can try topical therapy if this does not work. Follow up in 2-4 weeks depending on symptom progression.   Wallis BambergMario Jameria Bradway, PA-C [facility] [phone] 12/20/2016 10:05 AM

## 2016-12-20 NOTE — Patient Instructions (Addendum)
Acne Acne is a skin problem that causes pimples. Acne occurs when the pores in the skin get blocked. The pores may become infected with bacteria, or they may become red, sore, and swollen. Acne is a common skin problem, especially for teenagers. Acne usually goes away over time. What are the causes? Each pore contains an oil gland. Oil glands make an oily substance that is called sebum. Acne happens when these glands get plugged with sebum, dead skin cells, and dirt. Then, the bacteria that are normally found in the oil glands multiply and cause inflammation. Acne is commonly triggered by changes in your hormones. These hormonal changes can cause the oil glands to get bigger and to make more sebum. Factors that can make acne worse include:  Hormone changes during: ? Adolescence. ? Women's menstrual cycles. ? Pregnancy.  Oil-based cosmetics and hair products.  Harshly scrubbing the skin.  Strong soaps.  Stress.  Hormone problems that are due to certain diseases.  Long or oily hair rubbing against the skin.  Certain medicines.  Pressure from headbands, backpacks, or shoulder pads.  Exposure to certain oils and chemicals.  What increases the risk? This condition is more likely to develop in:  Teenagers.  People who have a family history of acne.  What are the signs or symptoms? Acne often occurs on the face, neck, chest, and upper back. Symptoms include:  Small, red bumps (pimples or papules).  Whiteheads.  Blackheads.  Small, pus-filled pimples (pustules).  Big, red pimples or pustules that feel tender.  More severe acne can cause:  An infected area that contains a collection of pus (abscess).  Hard, painful, fluid-filled sacs (cysts).  Scars.  How is this diagnosed? This condition is diagnosed with a medical history and physical exam. Blood tests may also be done. How is this treated? Treatment for this condition can vary depending on the severity of your  acne. Treatment may include:  Creams and lotions that prevent oil glands from clogging.  Creams and lotions that treat or prevent infections and inflammation.  Antibiotic medicines that are applied to the skin or taken as a pill.  Pills that decrease sebum production.  Birth control pills.  Light or laser treatments.  Surgery.  Injections of medicine into the affected areas.  Chemicals that cause peeling of the skin.  Your health care provider will also recommend the best way to take care of your skin. Good skin care is the most important part of treatment. Follow these instructions at home: Skin care Take care of your skin as told by your health care provider. You may be told to do these things:  Wash your skin gently at least two times each day, as well as: ? After you exercise. ? Before you go to bed.  Use mild soap.  Apply a water-based skin moisturizer after you wash your skin.  Use a sunscreen or sunblock with SPF 30 or greater. This is especially important if you are using acne medicines.  Choose cosmetics that will not plug your oil glands (are noncomedogenic).  Medicines  Take over-the-counter and prescription medicines only as told by your health care provider.  If you were prescribed an antibiotic medicine, apply or take it as told by your health care provider. Do not stop taking the antibiotic even if your condition improves. General instructions  Keep your hair clean and off of your face. If you have oily hair, shampoo your hair regularly or daily.  Avoid leaning your chin or   forehead against your hands.  Avoid wearing tight headbands or hats.  Avoid picking or squeezing your pimples. That can make your acne worse and cause scarring.  Keep all follow-up visits as told by your health care provider. This is important.  Shave gently and only when necessary.  Keep a food journal to figure out if any foods are linked with your acne. Contact a health  care provider if:  Your acne is not better after eight weeks.  Your acne gets worse.  You have a large area of skin that is red or tender.  You think that you are having side effects from any acne medicine. This information is not intended to replace advice given to you by your health care provider. Make sure you discuss any questions you have with your health care provider. Document Released: 12/30/1999 Document Revised: 09/02/2015 Document Reviewed: 03/10/2014 Elsevier Interactive Patient Education  2018 [employer]     Food Choices for Gastroesophageal Reflux Disease, Adult When you have gastroesophageal reflux disease (GERD), the foods you eat and your eating habits are very important. Choosing the right foods can help ease your discomfort. What guidelines do I need to follow?  Choose fruits, vegetables, whole grains, and low-fat dairy products.  Choose low-fat meat, fish, and poultry.  Limit fats such as oils, salad dressings, butter, nuts, and avocado.  Keep a food diary. This helps you identify foods that cause symptoms.  Avoid foods that cause symptoms. These may be different for everyone.  Eat small meals often instead of 3 large meals a day.  Eat your meals slowly, in a place where you are relaxed.  Limit fried foods.  Cook foods using methods other than frying.  Avoid drinking alcohol.  Avoid drinking large amounts of liquids with your meals.  Avoid bending over or lying down until 2-3 hours after eating. What foods are not recommended? These are some foods and drinks that may make your symptoms worse: Vegetables Tomatoes. Tomato juice. Tomato and spaghetti sauce. Chili peppers. Onion and garlic. Horseradish. Fruits Oranges, grapefruit, and lemon (fruit and juice). Meats High-fat meats, fish, and poultry. This includes hot dogs, ribs, ham, sausage, salami, and bacon. Dairy Whole milk and chocolate milk. Sour cream. Cream. Butter. Ice cream. Cream  cheese. Drinks Coffee and tea. Bubbly (carbonated) drinks or energy drinks. Condiments Hot sauce. Barbecue sauce. Sweets/Desserts Chocolate and cocoa. Donuts. Peppermint and spearmint. Fats and Oils High-fat foods. This includes [place] fries and potato chips. Other Vinegar. Strong spices. This includes black pepper, white pepper, red pepper, cayenne, curry powder, cloves, ginger, and chili powder. The items listed above may not be a complete list of foods and drinks to avoid. Contact your dietitian for more information. This information is not intended to replace advice given to you by your health care provider. Make sure you discuss any questions you have with your health care provider. Document Released: 07/03/2011 Document Revised: 06/09/2015 Document Reviewed: 11/05/2012 Elsevier Interactive Patient Education  2017 [employer]     IF you received an x-ray today, you will receive an invoice from [facility]. Please contact [facility] at [phone] with questions or concerns regarding your invoice.   IF you received labwork today, you will receive an invoice from [facility]. Please contact [facility] at [phone] with questions or concerns regarding your invoice.   Our billing staff will not be able to assist you with questions regarding bills from these companies.  You will be contacted with the lab results as soon as  they are available. The fastest way to get your results is to activate your My Chart account. Instructions are located on the last page of this paperwork. If you have not heard from [place] regarding the results in 2 weeks, please contact this office.

## 2016-12-21 LAB — H. PYLORI BREATH TEST: H PYLORI BREATH TEST: POSITIVE — AB

## 2016-12-26 ENCOUNTER — Encounter: Payer: Self-pay | Admitting: Urgent Care

## 2016-12-26 ENCOUNTER — Other Ambulatory Visit: Payer: Self-pay | Admitting: Urgent Care

## 2016-12-26 MED ORDER — CLARITHROMYCIN 500 MG PO TABS: 500.0000 mg | ORAL_TABLET | Freq: Two times a day (BID) | ORAL | 0 refills | Status: AC

## 2016-12-26 MED ORDER — AMOXICILLIN 500 MG PO CAPS: 1000.0000 mg | ORAL_CAPSULE | Freq: Two times a day (BID) | ORAL | 0 refills | Status: DC

## 2017-01-03 ENCOUNTER — Telehealth: Payer: Self-pay | Admitting: Urgent Care

## 2017-01-03 NOTE — Telephone Encounter (Signed)
Copied from CRM [phone]. Topic: Quick Communication - See Telephone Encounter >> Jan 03, 2017  2:18 PM Eston Mouldavis, Tyleah Loh B wrote: CRM for notification. See Telephone encounter for:  Refill  traMADol-acetaminophen (ULTRACET) 37.5-325 MG tablet  and meloxicam (MOBIC) 7.5 MG tablet  [facility]  on [address] 01/03/17.

## 2017-01-03 NOTE — Telephone Encounter (Signed)
Tramadol-acetaminophen refill. Last OV and refill on 11/29/16.

## 2017-01-04 NOTE — Telephone Encounter (Signed)
Please see note below. 

## 2017-01-04 NOTE — Telephone Encounter (Signed)
Adv pt med refill is still pending for the provider approval and refills may take up to 48-72 business hours. Pt states that is completely out of the medications.

## 2017-01-09 MED ORDER — MELOXICAM 7.5 MG PO TABS: 7.5000 mg | ORAL_TABLET | Freq: Every day | ORAL | 5 refills | Status: DC

## 2017-01-09 MED ORDER — TRAMADOL-ACETAMINOPHEN 37.5-325 MG PO TABS: 1.0000 | ORAL_TABLET | Freq: Three times a day (TID) | ORAL | 0 refills | Status: DC | PRN

## 2017-01-09 NOTE — Telephone Encounter (Signed)
Called pt to tell him that there is refills for melaxicam and to go by clinic to pick up RX ultracet. Pt is not taking his Ultracet daily.  Pt canceled his next appt.

## 2017-01-09 NOTE — Telephone Encounter (Signed)
Please let patient know that I provided him with refills for meloxicam. He can pick up his refill for Ultracet in the clinic. Remind patient that he is only supposed to take this is meloxicam isn't working. Ultracet can cause constipation and is not meant to be used daily. RTC for a recheck if he is using this daily.

## 2017-01-10 ENCOUNTER — Ambulatory Visit: Payer: Managed Care, Other (non HMO) | Admitting: Urgent Care

## 2017-03-27 MED FILL — AMOXICILLIN 500 MG CAPSULE: 500 | 7 days supply | Qty: 28 | Fill #0

## 2017-04-16 ENCOUNTER — Encounter: Payer: Self-pay | Admitting: *Deleted

## 2017-04-16 ENCOUNTER — Telehealth: Payer: Self-pay | Admitting: *Deleted

## 2017-04-16 NOTE — Telephone Encounter (Signed)
Prescription not picked up shredded 

## 2017-11-29 ENCOUNTER — Other Ambulatory Visit: Payer: Self-pay

## 2017-11-29 ENCOUNTER — Ambulatory Visit (INDEPENDENT_AMBULATORY_CARE_PROVIDER_SITE_OTHER): Payer: Managed Care, Other (non HMO) | Admitting: Emergency Medicine

## 2017-11-29 ENCOUNTER — Encounter: Payer: Self-pay | Admitting: Emergency Medicine

## 2017-11-29 VITALS — BP 121/65 | HR 65 | Temp 98.6°F | Resp 16 | Ht 67.25 in | Wt 181.4 lb

## 2017-11-29 DIAGNOSIS — Z13220 Encounter for screening for lipoid disorders: Secondary | ICD-10-CM | POA: Diagnosis not present

## 2017-11-29 DIAGNOSIS — Z0000 Encounter for general adult medical examination without abnormal findings: Secondary | ICD-10-CM

## 2017-11-29 DIAGNOSIS — Z1329 Encounter for screening for other suspected endocrine disorder: Secondary | ICD-10-CM

## 2017-11-29 DIAGNOSIS — Z23 Encounter for immunization: Secondary | ICD-10-CM | POA: Diagnosis not present

## 2017-11-29 DIAGNOSIS — Z13228 Encounter for screening for other metabolic disorders: Secondary | ICD-10-CM | POA: Diagnosis not present

## 2017-11-29 DIAGNOSIS — Z130 Encounter for screening for diseases of the blood and blood-forming organs and certain disorders involving the immune mechanism: Secondary | ICD-10-CM

## 2017-11-29 NOTE — Patient Instructions (Addendum)

## 2017-11-29 NOTE — Progress Notes (Signed)
Timothy Li 42 y.o.   Chief Complaint  Patient presents with  . Establish Care  . Annual Exam    HISTORY OF PRESENT ILLNESS: This is a 42 y.o. male is here for annual exam and to establish care. Has no complaints or medical concerns.  Needs proof of physical for work.  HPI   Prior to Admission medications   Medication Sig Start Date End Date Taking? Authorizing Provider  amoxicillin (AMOXIL) 500 MG capsule Take 2 capsules (1,000 mg total) by mouth 2 (two) times daily. 12/26/16   Wallis Bamberg, PA-C  clarithromycin (BIAXIN) 500 MG tablet Take 1 tablet (500 mg total) by mouth 2 (two) times daily. 12/26/16   Wallis Bamberg, PA-C  Clindamycin-Benzoyl Per, Refr, gel Apply 1 application topically 2 (two) times daily. 12/20/16   Wallis Bamberg, PA-C  doxycycline (VIBRA-TABS) 100 MG tablet Take 1 tablet (100 mg total) by mouth daily. 12/20/16   Wallis Bamberg, PA-C  hydrOXYzine (VISTARIL) 50 MG capsule Take 1 capsule (50 mg total) by mouth 3 (three) times daily as needed. 12/20/16   Wallis Bamberg, PA-C  meloxicam (MOBIC) 7.5 MG tablet Take 1 tablet (7.5 mg total) by mouth daily. 01/09/17   Wallis Bamberg, PA-C  omeprazole (PRILOSEC) 20 MG capsule Take 1 capsule (20 mg total) by mouth 2 (two) times daily before a meal. 12/20/16   Wallis Bamberg, PA-C  traMADol-acetaminophen (ULTRACET) 37.5-325 MG tablet Take 1 tablet by mouth every 8 (eight) hours as needed for severe pain. 01/09/17   Wallis Bamberg, PA-C    No Known Allergies  Patient Active Problem List   Diagnosis Date Noted  . Smoking 09/29/2012  . Arthritis of knee 09/29/2012    Past Medical History:  Diagnosis Date  . Allergy   . Arthritis   . Asthma     Past Surgical History:  Procedure Laterality Date  . FINGER SURGERY    . I&D EXTREMITY Left 01/29/2015   Procedure: IRRIGATION AND DEBRIDEMENT  INDEX FINGER;  Surgeon: Toni Arthurs, MD;  Location: [facility] OR;  Service: Orthopedics;  Laterality: Left;  . I&D EXTREMITY Left 02/02/2015   Procedure:  LEFT INDEX FINGER INCISION AND DRAINAGE AND DEBRIDEMENT;  Surgeon: Bradly Bienenstock, MD;  Location: [facility] OR;  Service: Orthopedics;  Laterality: Left;  . I&D EXTREMITY Left 02/04/2015   Procedure: IRRIGATION AND DEBRIDEMENT LEFT INDEX FINGER;  Surgeon: Bradly Bienenstock, MD;  Location: [facility];  Service: Orthopedics;  Laterality: Left;  . I&D EXTREMITY Left 02/07/2015   Procedure: Repeat IRRIGATION AND DEBRIDEMENT Left Index Finger;  Surgeon: Bradly Bienenstock, MD;  Location: [facility] OR;  Service: Orthopedics;  Laterality: Left;  . KNEE SURGERY Left   . WRIST SURGERY      Social History   Socioeconomic History  . Marital status: Single    Spouse name: Not on file  . Number of children: Not on file  . Years of education: Not on file  . Highest education level: Not on file  Occupational History  . Not on file  Social Needs  . Financial resource strain: Not on file  . Food insecurity:    Worry: Not on file    Inability: Not on file  . Transportation needs:    Medical: Not on file    Non-medical: Not on file  Tobacco Use  . Smoking status: Current Every Day Smoker    Packs/day: 1.00  . Smokeless tobacco: Never Used  Substance and Sexual Activity  . Alcohol use: No  . Drug use: No  Comment: marijuana- last one month ago  . Sexual activity: Not on file  Lifestyle  . Physical activity:    Days per week: Not on file    Minutes per session: Not on file  . Stress: Not on file  Relationships  . Social connections:    Talks on phone: Not on file    Gets together: Not on file    Attends religious service: Not on file    Active member of club or organization: Not on file    Attends meetings of clubs or organizations: Not on file    Relationship status: Not on file  . Intimate partner violence:    Fear of current or ex partner: Not on file    Emotionally abused: Not on file    Physically abused: Not on file    Forced sexual activity: Not on file  Other Topics Concern  . Not on file  Social History  Narrative  . Not on file    No family history on file.   Review of Systems  Constitutional: Negative.  Negative for chills and fever.  HENT: Negative.  Negative for congestion, nosebleeds and sore throat.   Eyes: Negative.  Negative for blurred vision and double vision.  Respiratory: Negative.  Negative for cough and shortness of breath.   Cardiovascular: Negative.  Negative for chest pain and palpitations.  Gastrointestinal: Negative.  Negative for abdominal pain, diarrhea, nausea and vomiting.  Genitourinary: Negative.  Negative for dysuria and hematuria.  Musculoskeletal: Positive for joint pain. Negative for back pain, myalgias and neck pain.  Skin: Negative.  Negative for rash.  Neurological: Negative.  Negative for dizziness and headaches.  Endo/Heme/Allergies: Negative.   All other systems reviewed and are negative.   Vitals:   11/29/17 1112  BP: 121/65  Pulse: 65  Resp: 16  Temp: 98.6 F (37 C)  SpO2: 99%    Physical Exam  Constitutional: He is oriented to person, place, and time. He appears well-developed and well-nourished.  HENT:  Head: Normocephalic and atraumatic.  Nose: Nose normal.  Mouth/Throat: Oropharynx is clear and moist.  Eyes: Pupils are equal, round, and reactive to light. Conjunctivae and EOM are normal.  Neck: Normal range of motion. Neck supple.  Cardiovascular: Normal rate, regular rhythm and normal heart sounds.  Pulmonary/Chest: Effort normal and breath sounds normal.  Abdominal: Soft. Bowel sounds are normal. He exhibits no distension. There is no tenderness.  Musculoskeletal: Normal range of motion.  Lymphadenopathy:    He has no cervical adenopathy.  Neurological: He is alert and oriented to person, place, and time. No sensory deficit. He exhibits normal muscle tone. Coordination normal.  Skin: Skin is warm and dry. Capillary refill takes less than 2 seconds. No rash noted.  Psychiatric: He has a normal mood and affect. His behavior is  normal.  Vitals reviewed.    ASSESSMENT & PLAN: [name] was seen today for establish care and annual exam.  Diagnoses and all orders for this visit:  Routine general medical examination at a health care facility  Need for prophylactic vaccination and inoculation against influenza -     Flu Vaccine QUAD 36+ mos IM  Screening for lipoid disorders -     Lipid panel  Screening for endocrine, metabolic and immunity disorder -     Comprehensive metabolic panel -     CBC with Differential/Platelet -     TSH -     Lipid panel    Patient Instructions  If you have lab work done today you will be contacted with your lab results within the next 2 weeks.  If you have not heard from [place] then please contact us. The fastest way to get your results is to register for My Chart.   IF you received an x-ray today, you will receive an invoice from [facility]. Please contact [facility] at [phone] with questions or concerns regarding your invoice.   IF you received labwork today, you will receive an invoice from [facility]. Please contact [facility] at [phone] with questions or concerns regarding your invoice.   Our billing staff will not be able to assist you with questions regarding bills from these companies.  You will be contacted with the lab results as soon as they are available. The fastest way to get your results is to activate your My Chart account. Instructions are located on the last page of this paperwork. If you have not heard from [place] regarding the results in 2 weeks, please contact this office.      Health Maintenance, Male A healthy lifestyle and preventive care is important for your health and wellness. Ask your health care provider about what schedule of regular examinations is right for you. What should I know about weight and diet? Eat a Healthy Diet  Eat plenty of vegetables, fruits, whole grains, low-fat dairy products, and lean  protein.  Do not eat a lot of foods high in solid fats, added sugars, or salt.  Maintain a Healthy Weight Regular exercise can help you achieve or maintain a healthy weight. You should:  Do at least 150 minutes of exercise each week. The exercise should increase your heart rate and make you sweat (moderate-intensity exercise).  Do strength-training exercises at least twice a week.  Watch Your Levels of Cholesterol and Blood Lipids  Have your blood tested for lipids and cholesterol every 5 years starting at 41 years of age. If you are at high risk for heart disease, you should start having your blood tested when you are 41 years old. You may need to have your cholesterol levels checked more often if: ? Your lipid or cholesterol levels are high. ? You are older than 41 years of age. ? You are at high risk for heart disease.  What should I know about cancer screening? Many types of cancers can be detected early and may often be prevented. Lung Cancer  You should be screened every year for lung cancer if: ? You are a current smoker who has smoked for at least 30 years. ? You are a former smoker who has quit within the past 15 years.  Talk to your health care provider about your screening options, when you should start screening, and how often you should be screened.  Colorectal Cancer  Routine colorectal cancer screening usually begins at 41 years of age and should be repeated every 5-10 years until you are 41 years old. You may need to be screened more often if early forms of precancerous polyps or small growths are found. Your health care provider may recommend screening at an earlier age if you have risk factors for colon cancer.  Your health care provider may recommend using home test kits to check for hidden blood in the stool.  A small camera at the end of a tube can be used to examine your colon (sigmoidoscopy or colonoscopy). This checks for the earliest forms of colorectal  cancer.  Prostate and Testicular Cancer  Depending on your  age and overall health, your health care provider may do certain tests to screen for prostate and testicular cancer.  Talk to your health care provider about any symptoms or concerns you have about testicular or prostate cancer.  Skin Cancer  Check your skin from head to toe regularly.  Tell your health care provider about any new moles or changes in moles, especially if: ? There is a change in a mole's size, shape, or color. ? You have a mole that is larger than a pencil eraser.  Always use sunscreen. Apply sunscreen liberally and repeat throughout the day.  Protect yourself by wearing long sleeves, pants, a wide-brimmed hat, and sunglasses when outside.  What should I know about heart disease, diabetes, and high blood pressure?  If you are 80-39 years of age, have your blood pressure checked every 3-5 years. If you are 51 years of age or older, have your blood pressure checked every year. You should have your blood pressure measured twice-once when you are at a hospital or clinic, and once when you are not at a hospital or clinic. Record the average of the two measurements. To check your blood pressure when you are not at a hospital or clinic, you can use: ? An automated blood pressure machine at a pharmacy. ? A home blood pressure monitor.  Talk to your health care provider about your target blood pressure.  If you are between 62-17 years old, ask your health care provider if you should take aspirin to prevent heart disease.  Have regular diabetes screenings by checking your fasting blood sugar level. ? If you are at a normal weight and have a low risk for diabetes, have this test once every three years after the age of 74. ? If you are overweight and have a high risk for diabetes, consider being tested at a younger age or more often.  A one-time screening for abdominal aortic aneurysm (AAA) by ultrasound is recommended  for men aged 65-75 years who are current or former smokers. What should I know about preventing infection? Hepatitis B If you have a higher risk for hepatitis B, you should be screened for this virus. Talk with your health care provider to find out if you are at risk for hepatitis B infection. Hepatitis C Blood testing is recommended for:  Everyone born from [birth year] through 1964.  Anyone with known risk factors for hepatitis C.  Sexually Transmitted Diseases (STDs)  You should be screened each year for STDs including gonorrhea and chlamydia if: ? You are sexually active and are younger than 41 years of age. ? You are older than 41 years of age and your health care provider tells you that you are at risk for this type of infection. ? Your sexual activity has changed since you were last screened and you are at an increased risk for chlamydia or gonorrhea. Ask your health care provider if you are at risk.  Talk with your health care provider about whether you are at high risk of being infected with HIV. Your health care provider may recommend a prescription medicine to help prevent HIV infection.  What else can I do?  Schedule regular health, dental, and eye exams.  Stay current with your vaccines (immunizations).  Do not use any tobacco products, such as cigarettes, chewing tobacco, and e-cigarettes. If you need help quitting, ask your health care provider.  Limit alcohol intake to no more than 2 drinks per day. One drink equals 12 ounces of  beer, 5 ounces of wine, or 1 ounces of hard liquor.  Do not use street drugs.  Do not share needles.  Ask your health care provider for help if you need support or information about quitting drugs.  Tell your health care provider if you often feel depressed.  Tell your health care provider if you have ever been abused or do not feel safe at home. This information is not intended to replace advice given to you by your health care provider. Make  sure you discuss any questions you have with your health care provider. Document Released: 06/30/2007 Document Revised: 08/31/2015 Document Reviewed: 10/05/2014 Elsevier Interactive Patient Education  2018 [employer].      Edwina BarthMiguel Makailah Slavick, MD [facility]

## 2017-11-30 LAB — CBC WITH DIFFERENTIAL/PLATELET
BASOS ABS: 0.1 10*3/uL (ref 0.0–0.2)
Basos: 1 %
EOS (ABSOLUTE): 0.2 10*3/uL (ref 0.0–0.4)
Eos: 2 %
Hematocrit: 38.1 % (ref 37.5–51.0)
Hemoglobin: 13.1 g/dL (ref 13.0–17.7)
IMMATURE GRANS (ABS): 0 10*3/uL (ref 0.0–0.1)
IMMATURE GRANULOCYTES: 0 %
LYMPHS: 31 %
Lymphocytes Absolute: 2.2 10*3/uL (ref 0.7–3.1)
MCH: 31.3 pg (ref 26.6–33.0)
MCHC: 34.4 g/dL (ref 31.5–35.7)
MCV: 91 fL (ref 79–97)
Monocytes Absolute: 0.4 10*3/uL (ref 0.1–0.9)
Monocytes: 6 %
NEUTROS PCT: 60 %
Neutrophils Absolute: 4.2 10*3/uL (ref 1.4–7.0)
Platelets: 311 10*3/uL (ref 150–450)
RBC: 4.19 x10E6/uL (ref 4.14–5.80)
RDW: 13.4 % (ref 12.3–15.4)
WBC: 7 10*3/uL (ref 3.4–10.8)

## 2017-11-30 LAB — COMPREHENSIVE METABOLIC PANEL
ALK PHOS: 68 IU/L (ref 39–117)
ALT: 13 IU/L (ref 0–44)
AST: 12 IU/L (ref 0–40)
Albumin/Globulin Ratio: 2 (ref 1.2–2.2)
Albumin: 4.4 g/dL (ref 3.5–5.5)
BUN/Creatinine Ratio: 12 (ref 9–20)
BUN: 10 mg/dL (ref 6–24)
CHLORIDE: 104 mmol/L (ref 96–106)
CO2: 21 mmol/L (ref 20–29)
CREATININE: 0.82 mg/dL (ref 0.76–1.27)
Calcium: 9.1 mg/dL (ref 8.7–10.2)
GFR calc Af Amer: 127 mL/min/{1.73_m2} (ref >59)
GFR calc non Af Amer: 110 mL/min/{1.73_m2} (ref >59)
GLUCOSE: 106 mg/dL — AB (ref 65–99)
Globulin, Total: 2.2 g/dL (ref 1.5–4.5)
Potassium: 3.9 mmol/L (ref 3.5–5.2)
Sodium: 140 mmol/L (ref 134–144)
Total Protein: 6.6 g/dL (ref 6.0–8.5)

## 2017-11-30 LAB — LIPID PANEL
CHOLESTEROL TOTAL: 176 mg/dL (ref 100–199)
Chol/HDL Ratio: 4.1 ratio (ref 0.0–5.0)
HDL: 43 mg/dL (ref >39)
LDL Calculated: 115 mg/dL — ABNORMAL HIGH (ref 0–99)
TRIGLYCERIDES: 91 mg/dL (ref 0–149)
VLDL Cholesterol Cal: 18 mg/dL (ref 5–40)

## 2017-11-30 LAB — TSH: TSH: 0.421 u[IU]/mL — AB (ref 0.450–4.500)

## 2017-12-02 ENCOUNTER — Encounter: Payer: Self-pay | Admitting: *Deleted

## 2017-12-23 ENCOUNTER — Ambulatory Visit: Payer: Managed Care, Other (non HMO) | Admitting: Sports Medicine

## 2017-12-23 ENCOUNTER — Encounter: Payer: Self-pay | Admitting: Sports Medicine

## 2017-12-23 VITALS — BP 110/80 | Ht 67.0 in | Wt 190.0 lb

## 2017-12-23 DIAGNOSIS — M1710 Unilateral primary osteoarthritis, unspecified knee: Secondary | ICD-10-CM

## 2017-12-23 MED ORDER — METHYLPREDNISOLONE ACETATE 40 MG/ML IJ SUSP
40.0000 mg | Freq: Once | INTRAMUSCULAR | Status: AC
  Administered 2017-12-23: 40 mg via INTRA_ARTICULAR

## 2017-12-23 MED ORDER — TRAMADOL-ACETAMINOPHEN 37.5-325 MG PO TABS: 1.0000 | ORAL_TABLET | Freq: Two times a day (BID) | ORAL | 1 refills | Status: DC | PRN

## 2017-12-23 MED ORDER — MELOXICAM 15 MG PO TABS: 15.0000 mg | ORAL_TABLET | Freq: Every day | ORAL | 1 refills | Status: DC | PRN

## 2017-12-23 MED FILL — MELOXICAM 15 MG TABLET: 15 | 30 days supply | Qty: 30 | Fill #0

## 2017-12-23 MED FILL — TRAMADOL-ACETAMINOPHN 37.5-: 37.5-325 | 30 days supply | Qty: 60 | Fill #0

## 2017-12-23 NOTE — Assessment & Plan Note (Addendum)
Patient with significant degenerative arthritis of left knee, also distant history of surgery when he was a child that did have significant infection.  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  Has been using over-the-counter aspirin and BC powders for pain control.  1 advised not to use aspirin and BC powders for pain control 2 prescribed Ultracet and meloxicam 3 Depo-Medrol injection to knee 4 prescribe double upright brace of left knee 5 again we have advised the patient will eventually require surgery, he is nervous about this given his ability to still function albeit with pain because he has financial concerns.

## 2017-12-23 NOTE — Progress Notes (Signed)
Subjective:  Timothy Li is a 42 y.o. male who [facility] with a chief complaint of degenerative arthritis of left knee.Marland Kitchen.   HPI: Patient has been receiving injections to left knee, these usually last for about 6 months he says but during the winter season sits a little harder for him.  His last was approximately 2 months ago.  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  He is able to walk around and says that his knee is currently popping and hurting but he is able to complete work.  No new significant injuries.  No new significant changes in ability to function.  He can planes of no distal sensation changes or weakness.   Has history of Ultracet and meloxicam prescriptions but due to insurance loss has been using aspirin and BC powders over-the-counter.  Denies illicit substances  Objective:  Physical Exam: BP 110/80   Ht 5\' 7"  (1.702 m)   Wt 190 lb (86.2 kg)   BMI 29.76 kg/m   Gen: NAD, resting comfortably MSK: Left knee with large anterior scar approximately 4 inches above the knee to 3 inches below.  Large placement of medial femoral condyle.  Also superolateral displacement of the patella on left.  No abnormal warmth on palpation, no specific pain point.  Range of motion with limited extension by about 10 to 15 degrees and limited flexion to just past 90.  Strength appears symmetric with right.  Left knee with valgus pseudolaxity, varus\anterior drawer\posterior drawer within normal limits .  No distal sensation or neurovascular abnormalities.  Right leg examined and found to be normal with no concerns. Skin: warm, dry Neuro: grossly normal, moves all extremities Psych: Normal affect and thought content  No results found for this or any previous visit (from the past 72 hour(s)).   Assessment/Plan:  Arthritis of knee Patient with significant degenerative arthritis of left knee, also  distant history of surgery when he was a child that did have significant infection.  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  Has been using over-the-counter aspirin and BC powders for pain control.  1 advised not to use aspirin and BC powders for pain control 2 prescribed Ultracet and meloxicam 3 Depo-Medrol injection to knee 4 prescribe double upright brace of left knee 5 again we have advised the patient will eventually require surgery, he is nervous about this given his ability to still function albeit with pain because he has financial concerns.   Consent obtained and verified. Time-out conducted. Noted no overlying erythema, induration, or other signs of local infection. Skin prepped in a sterile fashion. Topical analgesic spray: Ethyl chloride. Joint: Left knee, lateral approach Needle: 25-gauge, 1.5 inch Completed without difficulty. Meds: 3 cc Novocain, 1 cc (40 mg Depo-Medrol)  Advised to call if fevers/chills, erythema, induration, drainage, or persistent bleeding.    Marthenia RollingScott Rosanna Bickle, DO FAMILY MEDICINE RESIDENT - PGY2 12/23/2017 11:46 AM   Patient seen and evaluated with the resident.  I agree with the above plan of care.  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  He is not interested in arthroplasty at this time.  We injected his knee with cortisone today.  An anterior lateral approach was utilized after risks and benefits were explained.  He tolerated this without difficulty.  He has done well with meloxicam and tramadol in  the past.  I will provide him with refills on both of those today.  I also think he will do well with a double upright brace when working.  Patient will follow-up with me as needed.

## 2018-02-24 MED FILL — MELOXICAM 15 MG TABLET: 15 | 30 days supply | Qty: 30 | Fill #1

## 2018-02-24 MED FILL — TRAMADOL-ACETAMINOPHN 37.5-: 37.5-325 | 30 days supply | Qty: 60 | Fill #1

## 2018-05-29 ENCOUNTER — Other Ambulatory Visit: Payer: Self-pay | Admitting: *Deleted

## 2018-05-29 MED ORDER — TRAMADOL-ACETAMINOPHEN 37.5-325 MG PO TABS: 1.0000 | ORAL_TABLET | Freq: Two times a day (BID) | ORAL | 0 refills | Status: DC | PRN

## 2018-05-29 MED ORDER — MELOXICAM 15 MG PO TABS: 15.0000 mg | ORAL_TABLET | Freq: Every day | ORAL | 0 refills | Status: DC | PRN

## 2018-12-01 ENCOUNTER — Encounter: Payer: Self-pay | Admitting: Emergency Medicine

## 2018-12-01 ENCOUNTER — Ambulatory Visit (INDEPENDENT_AMBULATORY_CARE_PROVIDER_SITE_OTHER): Payer: Managed Care, Other (non HMO) | Admitting: Emergency Medicine

## 2018-12-01 ENCOUNTER — Other Ambulatory Visit: Payer: Self-pay

## 2018-12-01 VITALS — BP 110/66 | HR 74 | Temp 98.9°F | Resp 16 | Ht 66.0 in | Wt 178.0 lb

## 2018-12-01 DIAGNOSIS — Z0001 Encounter for general adult medical examination with abnormal findings: Secondary | ICD-10-CM

## 2018-12-01 DIAGNOSIS — Z1329 Encounter for screening for other suspected endocrine disorder: Secondary | ICD-10-CM | POA: Diagnosis not present

## 2018-12-01 DIAGNOSIS — Z130 Encounter for screening for diseases of the blood and blood-forming organs and certain disorders involving the immune mechanism: Secondary | ICD-10-CM

## 2018-12-01 DIAGNOSIS — Z13228 Encounter for screening for other metabolic disorders: Secondary | ICD-10-CM

## 2018-12-01 DIAGNOSIS — Z13220 Encounter for screening for lipoid disorders: Secondary | ICD-10-CM

## 2018-12-01 DIAGNOSIS — G8929 Other chronic pain: Secondary | ICD-10-CM

## 2018-12-01 DIAGNOSIS — Z0000 Encounter for general adult medical examination without abnormal findings: Secondary | ICD-10-CM

## 2018-12-01 DIAGNOSIS — M25562 Pain in left knee: Secondary | ICD-10-CM

## 2018-12-01 NOTE — Patient Instructions (Addendum)
   If you have lab work done today you will be contacted with your lab results within the next 2 weeks.  If you have not heard from us then please contact us. The fastest way to get your results is to register for My Chart.   IF you received an x-ray today, you will receive an invoice from [facility]. Please contact [facility] at [phone] with questions or concerns regarding your invoice.   IF you received labwork today, you will receive an invoice from [facility]. Please contact [facility] at [phone] with questions or concerns regarding your invoice.   Our billing staff will not be able to assist you with questions regarding bills from these companies.  You will be contacted with the lab results as soon as they are available. The fastest way to get your results is to activate your My Chart account. Instructions are located on the last page of this paperwork. If you have not heard from us regarding the results in 2 weeks, please contact this office.     Health Maintenance, Male Adopting a healthy lifestyle and getting preventive care are important in promoting health and wellness. Ask your health care provider about:  The right schedule for you to have regular tests and exams.  Things you can do on your own to prevent diseases and keep yourself healthy. What should I know about diet, weight, and exercise? Eat a healthy diet   Eat a diet that includes plenty of vegetables, fruits, low-fat dairy products, and lean protein.  Do not eat a lot of foods that are high in solid fats, added sugars, or sodium. Maintain a healthy weight Body mass index (BMI) is a measurement that can be used to identify possible weight problems. It estimates body fat based on height and weight. Your health care provider can help determine your BMI and help you achieve or maintain a healthy weight. Get regular exercise Get regular exercise. This is one of the most important things you  can do for your health. Most adults should:  Exercise for at least 150 minutes each week. The exercise should increase your heart rate and make you sweat (moderate-intensity exercise).  Do strengthening exercises at least twice a week. This is in addition to the moderate-intensity exercise.  Spend less time sitting. Even light physical activity can be beneficial. Watch cholesterol and blood lipids Have your blood tested for lipids and cholesterol at 42 years of age, then have this test every 5 years. You may need to have your cholesterol levels checked more often if:  Your lipid or cholesterol levels are high.  You are older than 42 years of age.  You are at high risk for heart disease. What should I know about cancer screening? Many types of cancers can be detected early and may often be prevented. Depending on your health history and family history, you may need to have cancer screening at various ages. This may include screening for:  Colorectal cancer.  Prostate cancer.  Skin cancer.  Lung cancer. What should I know about heart disease, diabetes, and high blood pressure? Blood pressure and heart disease  High blood pressure causes heart disease and increases the risk of stroke. This is more likely to develop in people who have high blood pressure readings, are of African descent, or are overweight.  Talk with your health care provider about your target blood pressure readings.  Have your blood pressure checked: ? Every 3-5 years if you are 18-39 years   of age. ? Every year if you are 40 years old or older.  If you are between the ages of 65 and 75 and are a current or former smoker, ask your health care provider if you should have a one-time screening for abdominal aortic aneurysm (AAA). Diabetes Have regular diabetes screenings. This checks your fasting blood sugar level. Have the screening done:  Once every three years after age 45 if you are at a normal weight and have  a low risk for diabetes.  More often and at a younger age if you are overweight or have a high risk for diabetes. What should I know about preventing infection? Hepatitis B If you have a higher risk for hepatitis B, you should be screened for this virus. Talk with your health care provider to find out if you are at risk for hepatitis B infection. Hepatitis C Blood testing is recommended for:  Everyone born from 1945 through 1965.  Anyone with known risk factors for hepatitis C. Sexually transmitted infections (STIs)  You should be screened each year for STIs, including gonorrhea and chlamydia, if: ? You are sexually active and are younger than 42 years of age. ? You are older than 42 years of age and your health care provider tells you that you are at risk for this type of infection. ? Your sexual activity has changed since you were last screened, and you are at increased risk for chlamydia or gonorrhea. Ask your health care provider if you are at risk.  Ask your health care provider about whether you are at high risk for HIV. Your health care provider may recommend a prescription medicine to help prevent HIV infection. If you choose to take medicine to prevent HIV, you should first get tested for HIV. You should then be tested every 3 months for as long as you are taking the medicine. Follow these instructions at home: Lifestyle  Do not use any products that contain nicotine or tobacco, such as cigarettes, e-cigarettes, and chewing tobacco. If you need help quitting, ask your health care provider.  Do not use street drugs.  Do not share needles.  Ask your health care provider for help if you need support or information about quitting drugs. Alcohol use  Do not drink alcohol if your health care provider tells you not to drink.  If you drink alcohol: ? Limit how much you have to 0-2 drinks a day. ? Be aware of how much alcohol is in your drink. In the [place] one drink equals one 12  oz bottle of beer (355 mL), one 5 oz glass of wine (148 mL), or one 1 oz glass of hard liquor (44 mL). General instructions  Schedule regular health, dental, and eye exams.  Stay current with your vaccines.  Tell your health care provider if: ? You often feel depressed. ? You have ever been abused or do not feel safe at home. Summary  Adopting a healthy lifestyle and getting preventive care are important in promoting health and wellness.  Follow your health care provider's instructions about healthy diet, exercising, and getting tested or screened for diseases.  Follow your health care provider's instructions on monitoring your cholesterol and blood pressure. This information is not intended to replace advice given to you by your health care provider. Make sure you discuss any questions you have with your health care provider. Document Released: 06/30/2007 Document Revised: 12/25/2017 Document Reviewed: 12/25/2017 Elsevier Patient Education  2020 Elsevier Inc.  

## 2018-12-01 NOTE — Progress Notes (Signed)
Timothy Li 43 y.o.   No chief complaint on file.   HISTORY OF PRESENT ILLNESS: This is a 43 y.o. male here for his annual physical. Also has a history of chronic left knee pain requesting refill on meloxicam and Ultracet, medication that was prescribed originally by his DO sports medicine doctor Reino Bellis. No complaints or medical concerns today. Needs proof of physical form for work.  HPI   Prior to Admission medications   Medication Sig Start Date End Date Taking? Authorizing Provider  amoxicillin (AMOXIL) 500 MG capsule Take 2 capsules (1,000 mg total) by mouth 2 (two) times daily. Patient not taking: Reported on 12/23/2017 12/26/16   Wallis Bamberg, PA-C  clarithromycin (BIAXIN) 500 MG tablet Take 1 tablet (500 mg total) by mouth 2 (two) times daily. Patient not taking: Reported on 12/23/2017 12/26/16   Wallis Bamberg, PA-C  Clindamycin-Benzoyl Per, Refr, gel Apply 1 application topically 2 (two) times daily. Patient not taking: Reported on 12/23/2017 12/20/16   Wallis Bamberg, PA-C  doxycycline (VIBRA-TABS) 100 MG tablet Take 1 tablet (100 mg total) by mouth daily. Patient not taking: Reported on 12/23/2017 12/20/16   Wallis Bamberg, PA-C  hydrOXYzine (VISTARIL) 50 MG capsule Take 1 capsule (50 mg total) by mouth 3 (three) times daily as needed. Patient not taking: Reported on 12/23/2017 12/20/16   Wallis Bamberg, PA-C  meloxicam (MOBIC) 15 MG tablet Take 1 tablet (15 mg total) by mouth daily as needed for pain. 05/29/18   Ralene Cork, DO  omeprazole (PRILOSEC) 20 MG capsule Take 1 capsule (20 mg total) by mouth 2 (two) times daily before a meal. Patient not taking: Reported on 12/23/2017 12/20/16   Wallis Bamberg, PA-C  traMADol-acetaminophen (ULTRACET) 37.5-325 MG tablet Take 1 tablet by mouth 2 (two) times daily as needed for severe pain. 05/29/18   Ralene Cork, DO    No Known Allergies  Patient Active Problem List   Diagnosis Date Noted  . Smoking 09/29/2012  . Arthritis of  knee 09/29/2012    Past Medical History:  Diagnosis Date  . Allergy   . Arthritis   . Asthma     Past Surgical History:  Procedure Laterality Date  . FINGER SURGERY    . FRACTURE SURGERY    . I&D EXTREMITY Left 01/29/2015   Procedure: IRRIGATION AND DEBRIDEMENT  INDEX FINGER;  Surgeon: Toni Arthurs, MD;  Location: [facility] OR;  Service: Orthopedics;  Laterality: Left;  . I&D EXTREMITY Left 02/02/2015   Procedure: LEFT INDEX FINGER INCISION AND DRAINAGE AND DEBRIDEMENT;  Surgeon: Bradly Bienenstock, MD;  Location: [facility] OR;  Service: Orthopedics;  Laterality: Left;  . I&D EXTREMITY Left 02/04/2015   Procedure: IRRIGATION AND DEBRIDEMENT LEFT INDEX FINGER;  Surgeon: Bradly Bienenstock, MD;  Location: [facility] OR[facility];  Service: Orthopedics;  Laterality: Left;  . I&D EXTREMITY Left 02/07/2015   Procedure: Repeat IRRIGATION AND DEBRIDEMENT Left Index Finger;  Surgeon: Bradly Bienenstock, MD;  Location: [facility] OR;  Service: Orthopedics;  Laterality: Left;  . JOINT REPLACEMENT    . KNEE SURGERY Left   . WRIST SURGERY      Social History   Socioeconomic History  . Marital status: Single    Spouse name: Not on file  . Number of children: Not on file  . Years of education: Not on file  . Highest education level: Not on file  Occupational History  . Not on file  Social Needs  . Financial resource strain: Not on file  . Food insecurity  Worry: Not on file    Inability: Not on file  . Transportation needs    Medical: Not on file    Non-medical: Not on file  Tobacco Use  . Smoking status: Current Every Day Smoker    Packs/day: 1.00  . Smokeless tobacco: Never Used  Substance and Sexual Activity  . Alcohol use: No  . Drug use: No    Comment: marijuana- last one month ago  . Sexual activity: Not on file  Lifestyle  . Physical activity    Days per week: Not on file    Minutes per session: Not on file  . Stress: Not on file  Relationships  . Social Musician on phone: Not on file    Gets together: Not on  file    Attends religious service: Not on file    Active member of club or organization: Not on file    Attends meetings of clubs or organizations: Not on file    Relationship status: Not on file  . Intimate partner violence    Fear of current or ex partner: Not on file    Emotionally abused: Not on file    Physically abused: Not on file    Forced sexual activity: Not on file  Other Topics Concern  . Not on file  Social History Narrative  . Not on file    No family history on file.   Review of Systems  Constitutional: Negative.  Negative for chills and fever.  HENT: Negative.  Negative for congestion and sore throat.   Eyes: Negative.   Respiratory: Negative.  Negative for cough and shortness of breath.   Cardiovascular: Negative.  Negative for chest pain and palpitations.  Gastrointestinal: Negative.  Negative for abdominal pain, constipation, diarrhea, melena, nausea and vomiting.  Genitourinary: Negative.  Negative for dysuria.  Musculoskeletal: Negative.   Skin: Negative.   Neurological: Negative.  Negative for dizziness and headaches.  All other systems reviewed and are negative.   Vitals:   12/01/18 1522  BP: 110/66  Pulse: 74  Resp: 16  Temp: 98.9 F (37.2 C)  SpO2: 98%    Physical Exam Vitals signs reviewed.  Constitutional:      Appearance: Normal appearance.  HENT:     Head: Normocephalic.     Mouth/Throat:     Mouth: Mucous membranes are moist.     Pharynx: Oropharynx is clear.  Eyes:     Extraocular Movements: Extraocular movements intact.     Conjunctiva/sclera: Conjunctivae normal.     Pupils: Pupils are equal, round, and reactive to light.  Neck:     Musculoskeletal: Normal range of motion and neck supple.  Cardiovascular:     Rate and Rhythm: Normal rate and regular rhythm.     Pulses: Normal pulses.     Heart sounds: Normal heart sounds.  Pulmonary:     Effort: Pulmonary effort is normal.     Breath sounds: Normal breath sounds.   Abdominal:     General: There is no distension.     Palpations: Abdomen is soft. There is no mass.     Tenderness: There is no abdominal tenderness.  Musculoskeletal: Normal range of motion.  Skin:    General: Skin is warm.  Neurological:     General: No focal deficit present.     Mental Status: He is alert and oriented to person, place, and time.  Psychiatric:        Mood and Affect: Mood normal.  Behavior: Behavior normal.      ASSESSMENT & PLAN: Rella Larvemmanuel was seen today for annual exam and medication refill.  Diagnoses and all orders for this visit:  Routine general medical examination at a health care facility  Chronic pain of left knee  Screening for deficiency anemia -     CBC with Differential  Screening for lipoid disorders -     Lipid panel  Screening for endocrine, metabolic and immunity disorder -     Comprehensive metabolic panel -     TSH    Patient Instructions       If you have lab work done today you will be contacted with your lab results within the next 2 weeks.  If you have not heard from us then please contact us. The fastest way to get your results is to register for My Chart.   IF you received an x-ray today, you will receive an invoice from [facility]. Please contact [facility] at [phone] with questions or concerns regarding your invoice.   IF you received labwork today, you will receive an invoice from [facility]. Please contact [facility] at [phone] with questions or concerns regarding your invoice.   Our billing staff will not be able to assist you with questions regarding bills from these companies.  You will be contacted with the lab results as soon as they are available. The fastest way to get your results is to activate your My Chart account. Instructions are located on the last page of this paperwork. If you have not heard from us regarding the results in 2 weeks, please contact this office.       Health Maintenance, Male Adopting a healthy lifestyle and getting preventive care are important in promoting health and wellness. Ask your health care provider about:  The right schedule for you to have regular tests and exams.  Things you can do on your own to prevent diseases and keep yourself healthy. What should I know about diet, weight, and exercise? Eat a healthy diet   Eat a diet that includes plenty of vegetables, fruits, low-fat dairy products, and lean protein.  Do not eat a lot of foods that are high in solid fats, added sugars, or sodium. Maintain a healthy weight Body mass index (BMI) is a measurement that can be used to identify possible weight problems. It estimates body fat based on height and weight. Your health care provider can help determine your BMI and help you achieve or maintain a healthy weight. Get regular exercise Get regular exercise. This is one of the most important things you can do for your health. Most adults should:  Exercise for at least 150 minutes each week. The exercise should increase your heart rate and make you sweat (moderate-intensity exercise).  Do strengthening exercises at least twice a week. This is in addition to the moderate-intensity exercise.  Spend less time sitting. Even light physical activity can be beneficial. Watch cholesterol and blood lipids Have your blood tested for lipids and cholesterol at 42 years of age, then have this test every 5 years. You may need to have your cholesterol levels checked more often if:  Your lipid or cholesterol levels are high.  You are older than 42 years of age.  You are at high risk for heart disease. What should I know about cancer screening? Many types of cancers can be detected early and may often be prevented. Depending on your health history and family history, you may need to have  cancer screening at various ages. This may include screening for:  Colorectal cancer.  Prostate cancer.   Skin cancer.  Lung cancer. What should I know about heart disease, diabetes, and high blood pressure? Blood pressure and heart disease  High blood pressure causes heart disease and increases the risk of stroke. This is more likely to develop in people who have high blood pressure readings, are of African descent, or are overweight.  Talk with your health care provider about your target blood pressure readings.  Have your blood pressure checked: ? Every 3-5 years if you are 34-11 years of age. ? Every year if you are 11 years old or older.  If you are between the ages of 15 and 17 and are a current or former smoker, ask your health care provider if you should have a one-time screening for abdominal aortic aneurysm (AAA). Diabetes Have regular diabetes screenings. This checks your fasting blood sugar level. Have the screening done:  Once every three years after age 20 if you are at a normal weight and have a low risk for diabetes.  More often and at a younger age if you are overweight or have a high risk for diabetes. What should I know about preventing infection? Hepatitis B If you have a higher risk for hepatitis B, you should be screened for this virus. Talk with your health care provider to find out if you are at risk for hepatitis B infection. Hepatitis C Blood testing is recommended for:  Everyone born from [birth year] through 1965.  Anyone with known risk factors for hepatitis C. Sexually transmitted infections (STIs)  You should be screened each year for STIs, including gonorrhea and chlamydia, if: ? You are sexually active and are younger than 42 years of age. ? You are older than 42 years of age and your health care provider tells you that you are at risk for this type of infection. ? Your sexual activity has changed since you were last screened, and you are at increased risk for chlamydia or gonorrhea. Ask your health care provider if you are at risk.  Ask your health care  provider about whether you are at high risk for HIV. Your health care provider may recommend a prescription medicine to help prevent HIV infection. If you choose to take medicine to prevent HIV, you should first get tested for HIV. You should then be tested every 3 months for as long as you are taking the medicine. Follow these instructions at home: Lifestyle  Do not use any products that contain nicotine or tobacco, such as cigarettes, e-cigarettes, and chewing tobacco. If you need help quitting, ask your health care provider.  Do not use street drugs.  Do not share needles.  Ask your health care provider for help if you need support or information about quitting drugs. Alcohol use  Do not drink alcohol if your health care provider tells you not to drink.  If you drink alcohol: ? Limit how much you have to 0-2 drinks a day. ? Be aware of how much alcohol is in your drink. In the [place] one drink equals one 12 oz bottle of beer (355 mL), one 5 oz glass of wine (148 mL), or one 1 oz glass of hard liquor (44 mL). General instructions  Schedule regular health, dental, and eye exams.  Stay current with your vaccines.  Tell your health care provider if: ? You often feel depressed. ? You have ever been abused or do not feel  safe at home. Summary  Adopting a healthy lifestyle and getting preventive care are important in promoting health and wellness.  Follow your health care provider's instructions about healthy diet, exercising, and getting tested or screened for diseases.  Follow your health care provider's instructions on monitoring your cholesterol and blood pressure. This information is not intended to replace advice given to you by your health care provider. Make sure you discuss any questions you have with your health care provider. Document Released: 06/30/2007 Document Revised: 12/25/2017 Document Reviewed: 12/25/2017 Elsevier Patient Education  2020 [employer]vier Inc.       Edwina Barth, MD [facility]

## 2018-12-02 LAB — COMPREHENSIVE METABOLIC PANEL
ALT: 11 IU/L (ref 0–44)
AST: 17 IU/L (ref 0–40)
Albumin/Globulin Ratio: 2.1 (ref 1.2–2.2)
Albumin: 4.9 g/dL (ref 4.0–5.0)
Alkaline Phosphatase: 68 IU/L (ref 39–117)
BUN/Creatinine Ratio: 11 (ref 9–20)
BUN: 8 mg/dL (ref 6–24)
Bilirubin Total: <0.2 mg/dL (ref 0.0–1.2)
CO2: 22 mmol/L (ref 20–29)
Calcium: 9.5 mg/dL (ref 8.7–10.2)
Chloride: 105 mmol/L (ref 96–106)
Creatinine, Ser: 0.75 mg/dL — ABNORMAL LOW (ref 0.76–1.27)
GFR calc Af Amer: 131 mL/min/{1.73_m2} (ref >59)
GFR calc non Af Amer: 113 mL/min/{1.73_m2} (ref >59)
Globulin, Total: 2.3 g/dL (ref 1.5–4.5)
Glucose: 99 mg/dL (ref 65–99)
Potassium: 4.4 mmol/L (ref 3.5–5.2)
Sodium: 140 mmol/L (ref 134–144)
Total Protein: 7.2 g/dL (ref 6.0–8.5)

## 2018-12-02 LAB — CBC WITH DIFFERENTIAL/PLATELET
Basophils Absolute: 0.1 10*3/uL (ref 0.0–0.2)
Basos: 1 %
EOS (ABSOLUTE): 0.1 10*3/uL (ref 0.0–0.4)
Eos: 1 %
Hematocrit: 43.2 % (ref 37.5–51.0)
Hemoglobin: 14.4 g/dL (ref 13.0–17.7)
Immature Grans (Abs): 0 10*3/uL (ref 0.0–0.1)
Immature Granulocytes: 0 %
Lymphocytes Absolute: 2.8 10*3/uL (ref 0.7–3.1)
Lymphs: 29 %
MCH: 31 pg (ref 26.6–33.0)
MCHC: 33.3 g/dL (ref 31.5–35.7)
MCV: 93 fL (ref 79–97)
Monocytes Absolute: 0.4 10*3/uL (ref 0.1–0.9)
Monocytes: 4 %
Neutrophils Absolute: 6.2 10*3/uL (ref 1.4–7.0)
Neutrophils: 65 %
Platelets: 337 10*3/uL (ref 150–450)
RBC: 4.64 x10E6/uL (ref 4.14–5.80)
RDW: 13.5 % (ref 11.6–15.4)
WBC: 9.6 10*3/uL (ref 3.4–10.8)

## 2018-12-02 LAB — LIPID PANEL
Chol/HDL Ratio: 3.9 ratio (ref 0.0–5.0)
Cholesterol, Total: 177 mg/dL (ref 100–199)
HDL: 45 mg/dL (ref >39)
LDL Chol Calc (NIH): 119 mg/dL — ABNORMAL HIGH (ref 0–99)
Triglycerides: 68 mg/dL (ref 0–149)
VLDL Cholesterol Cal: 13 mg/dL (ref 5–40)

## 2018-12-02 LAB — TSH: TSH: 0.57 u[IU]/mL (ref 0.450–4.500)

## 2019-02-10 ENCOUNTER — Ambulatory Visit (INDEPENDENT_AMBULATORY_CARE_PROVIDER_SITE_OTHER): Payer: Managed Care, Other (non HMO) | Admitting: Sports Medicine

## 2019-02-10 ENCOUNTER — Encounter: Payer: Self-pay | Admitting: Sports Medicine

## 2019-02-10 ENCOUNTER — Other Ambulatory Visit: Payer: Self-pay

## 2019-02-10 VITALS — BP 120/84 | Ht 66.0 in | Wt 190.0 lb

## 2019-02-10 DIAGNOSIS — M1712 Unilateral primary osteoarthritis, left knee: Secondary | ICD-10-CM

## 2019-02-10 MED ORDER — METHYLPREDNISOLONE ACETATE 40 MG/ML IJ SUSP
40.0000 mg | Freq: Once | INTRAMUSCULAR | Status: AC
  Administered 2019-02-10: 40 mg via INTRA_ARTICULAR

## 2019-02-10 MED ORDER — MELOXICAM 15 MG PO TABS: 15.0000 mg | ORAL_TABLET | Freq: Every day | ORAL | 0 refills | Status: DC | PRN

## 2019-02-10 MED ORDER — TRAMADOL-ACETAMINOPHEN 37.5-325 MG PO TABS: 1.0000 | ORAL_TABLET | Freq: Two times a day (BID) | ORAL | 0 refills | Status: DC | PRN

## 2019-02-10 MED FILL — TRAMADOL-ACETAMINOPHN 37.5-: 37.5-325 | 30 days supply | Qty: 60 | Fill #0

## 2019-02-10 MED FILL — MELOXICAM 15 MG TABLET: 15 | 30 days supply | Qty: 30 | Fill #0
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
## 2019-04-27 ENCOUNTER — Other Ambulatory Visit: Payer: Self-pay | Admitting: *Deleted

## 2019-04-27 MED ORDER — TRAMADOL-ACETAMINOPHEN 37.5-325 MG PO TABS: 1.0000 | ORAL_TABLET | Freq: Two times a day (BID) | ORAL | 0 refills | Status: AC | PRN

## 2019-04-27 MED ORDER — MELOXICAM 15 MG PO TABS: 15.0000 mg | ORAL_TABLET | Freq: Every day | ORAL | 0 refills | Status: AC | PRN

## 2019-06-19 ENCOUNTER — Emergency Department (HOSPITAL_COMMUNITY)
Admission: EM | Admit: 2019-06-19 | Discharge: 2019-06-20 | Discharge status: Against Medical Advice | Payer: Managed Care, Other (non HMO) | Attending: Emergency Medicine | Admitting: Emergency Medicine

## 2019-06-19 ENCOUNTER — Other Ambulatory Visit: Payer: Self-pay

## 2019-06-19 ENCOUNTER — Encounter (HOSPITAL_COMMUNITY): Payer: Self-pay | Admitting: Emergency Medicine

## 2019-06-19 DIAGNOSIS — Z041 Encounter for examination and observation following transport accident: Secondary | ICD-10-CM | POA: Diagnosis present

## 2019-06-19 DIAGNOSIS — Z5321 Procedure and treatment not carried out due to patient leaving prior to being seen by health care provider: Secondary | ICD-10-CM | POA: Diagnosis not present
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
## 2019-06-19 NOTE — ED Notes (Signed)
Pt step outside
